# Patient Record
Sex: Female | Born: 1935
Health system: Southern US, Community
[De-identification: ages and names within clinical notes are randomized; demographics above are authoritative.]

## PROBLEM LIST (undated history)

## (undated) DIAGNOSIS — K589 Irritable bowel syndrome without diarrhea: Secondary | ICD-10-CM

## (undated) DIAGNOSIS — F32A Depression, unspecified: Secondary | ICD-10-CM

## (undated) DIAGNOSIS — C8590 Non-Hodgkin lymphoma, unspecified, unspecified site: Secondary | ICD-10-CM

## (undated) DIAGNOSIS — M3500 Sicca syndrome, unspecified: Secondary | ICD-10-CM

## (undated) DIAGNOSIS — C801 Malignant (primary) neoplasm, unspecified: Secondary | ICD-10-CM

## (undated) DIAGNOSIS — K113 Abscess of salivary gland: Secondary | ICD-10-CM

## (undated) DIAGNOSIS — IMO0001 Reserved for inherently not codable concepts without codable children: Secondary | ICD-10-CM

## (undated) DIAGNOSIS — F329 Major depressive disorder, single episode, unspecified: Secondary | ICD-10-CM

## (undated) DIAGNOSIS — Z86010 Personal history of colonic polyps: Secondary | ICD-10-CM

## (undated) DIAGNOSIS — R12 Heartburn: Secondary | ICD-10-CM

## (undated) DIAGNOSIS — IMO0002 Reserved for concepts with insufficient information to code with codable children: Secondary | ICD-10-CM

## (undated) DIAGNOSIS — K219 Gastro-esophageal reflux disease without esophagitis: Secondary | ICD-10-CM

## (undated) HISTORY — DX: Major depressive disorder, single episode, unspecified: F32.9

## (undated) HISTORY — DX: Personal history of colonic polyps: Z86.010

## (undated) HISTORY — PX: TONSILLECTOMY AND ADENOIDECTOMY: SUR1326

## (undated) HISTORY — DX: Sjogren syndrome, unspecified: M35.00

## (undated) HISTORY — DX: Reserved for inherently not codable concepts without codable children: IMO0001

## (undated) HISTORY — DX: Malignant (primary) neoplasm, unspecified: C80.1

## (undated) HISTORY — PX: CATARACT EXTRACTION: SUR2

## (undated) HISTORY — PX: APPENDECTOMY: SHX54

## (undated) HISTORY — DX: Irritable bowel syndrome, unspecified: K58.9

## (undated) HISTORY — PX: SALIVARY GLAND SURGERY: SHX768

## (undated) HISTORY — DX: Depression, unspecified: F32.A

## (undated) HISTORY — DX: Abscess of salivary gland: K11.3

## (undated) HISTORY — DX: Gastro-esophageal reflux disease without esophagitis: K21.9

## (undated) HISTORY — DX: Reserved for concepts with insufficient information to code with codable children: IMO0002

## (undated) HISTORY — DX: Heartburn: R12

---

## 1964-08-31 HISTORY — PX: VEIN SURGERY: SHX48

## 1984-08-31 HISTORY — PX: BREAST BIOPSY: SHX20

## 1990-08-31 DIAGNOSIS — C801 Malignant (primary) neoplasm, unspecified: Secondary | ICD-10-CM

## 1990-08-31 HISTORY — DX: Malignant (primary) neoplasm, unspecified: C80.1

## 1998-08-31 HISTORY — PX: OOPHORECTOMY: SHX86

## 1998-11-08 ENCOUNTER — Other Ambulatory Visit: Admission: RE | Admit: 1998-11-08 | Discharge: 1998-11-08 | Payer: Self-pay | Admitting: *Deleted

## 1998-12-16 ENCOUNTER — Encounter: Payer: Self-pay | Admitting: Thoracic Surgery

## 1998-12-17 ENCOUNTER — Encounter: Payer: Self-pay | Admitting: Thoracic Surgery

## 1998-12-17 ENCOUNTER — Inpatient Hospital Stay: Admission: RE | Admit: 1998-12-17 | Discharge: 1998-12-20 | Payer: Self-pay | Admitting: Thoracic Surgery

## 1998-12-18 ENCOUNTER — Encounter: Payer: Self-pay | Admitting: Thoracic Surgery

## 1998-12-19 ENCOUNTER — Encounter: Payer: Self-pay | Admitting: Thoracic Surgery

## 1999-03-18 ENCOUNTER — Encounter (INDEPENDENT_AMBULATORY_CARE_PROVIDER_SITE_OTHER): Payer: Self-pay | Admitting: Specialist

## 1999-03-18 ENCOUNTER — Inpatient Hospital Stay (HOSPITAL_COMMUNITY): Admission: RE | Admit: 1999-03-18 | Discharge: 1999-03-21 | Payer: Self-pay | Admitting: Obstetrics and Gynecology

## 1999-04-01 HISTORY — PX: LUNG REMOVAL, PARTIAL: SHX233

## 1999-04-23 ENCOUNTER — Encounter: Payer: Self-pay | Admitting: Thoracic Surgery

## 1999-04-24 ENCOUNTER — Encounter: Payer: Self-pay | Admitting: Thoracic Surgery

## 1999-04-24 ENCOUNTER — Inpatient Hospital Stay: Admission: RE | Admit: 1999-04-24 | Discharge: 1999-04-29 | Payer: Self-pay | Admitting: Thoracic Surgery

## 1999-04-24 ENCOUNTER — Encounter: Payer: Self-pay | Admitting: Anesthesiology

## 1999-04-24 ENCOUNTER — Encounter (INDEPENDENT_AMBULATORY_CARE_PROVIDER_SITE_OTHER): Payer: Self-pay | Admitting: Specialist

## 1999-04-25 ENCOUNTER — Encounter: Payer: Self-pay | Admitting: Thoracic Surgery

## 1999-04-26 ENCOUNTER — Encounter: Payer: Self-pay | Admitting: Thoracic Surgery

## 1999-04-27 ENCOUNTER — Encounter: Payer: Self-pay | Admitting: Thoracic Surgery

## 1999-04-27 ENCOUNTER — Encounter: Payer: Self-pay | Admitting: Vascular Surgery

## 1999-04-28 ENCOUNTER — Encounter: Payer: Self-pay | Admitting: Vascular Surgery

## 1999-04-29 ENCOUNTER — Encounter: Payer: Self-pay | Admitting: Thoracic Surgery

## 1999-07-29 ENCOUNTER — Encounter: Admission: RE | Admit: 1999-07-29 | Discharge: 1999-07-29 | Payer: Self-pay | Admitting: Thoracic Surgery

## 1999-07-29 ENCOUNTER — Encounter: Payer: Self-pay | Admitting: Thoracic Surgery

## 1999-10-20 ENCOUNTER — Encounter: Admission: RE | Admit: 1999-10-20 | Discharge: 1999-10-20 | Payer: Self-pay | Admitting: *Deleted

## 1999-10-20 ENCOUNTER — Encounter: Payer: Self-pay | Admitting: *Deleted

## 1999-10-24 ENCOUNTER — Encounter: Payer: Self-pay | Admitting: Thoracic Surgery

## 1999-10-24 ENCOUNTER — Encounter: Admission: RE | Admit: 1999-10-24 | Discharge: 1999-10-24 | Payer: Self-pay | Admitting: Thoracic Surgery

## 1999-11-03 ENCOUNTER — Ambulatory Visit: Admission: RE | Admit: 1999-11-03 | Discharge: 1999-11-03 | Payer: Self-pay | Admitting: Oncology

## 1999-11-10 ENCOUNTER — Other Ambulatory Visit: Admission: RE | Admit: 1999-11-10 | Discharge: 1999-11-10 | Payer: Self-pay | Admitting: *Deleted

## 1999-12-25 ENCOUNTER — Encounter: Payer: Self-pay | Admitting: Oncology

## 1999-12-25 ENCOUNTER — Encounter: Admission: RE | Admit: 1999-12-25 | Discharge: 1999-12-25 | Payer: Self-pay | Admitting: Oncology

## 2000-02-27 ENCOUNTER — Encounter: Payer: Self-pay | Admitting: Thoracic Surgery

## 2000-02-27 ENCOUNTER — Encounter: Admission: RE | Admit: 2000-02-27 | Discharge: 2000-02-27 | Payer: Self-pay | Admitting: Thoracic Surgery

## 2000-07-02 ENCOUNTER — Encounter: Payer: Self-pay | Admitting: Oncology

## 2000-07-02 ENCOUNTER — Encounter: Admission: RE | Admit: 2000-07-02 | Discharge: 2000-07-02 | Payer: Self-pay | Admitting: Oncology

## 2000-10-20 ENCOUNTER — Encounter: Admission: RE | Admit: 2000-10-20 | Discharge: 2000-10-20 | Payer: Self-pay | Admitting: *Deleted

## 2000-10-20 ENCOUNTER — Encounter: Payer: Self-pay | Admitting: *Deleted

## 2000-11-12 ENCOUNTER — Other Ambulatory Visit: Admission: RE | Admit: 2000-11-12 | Discharge: 2000-11-12 | Payer: Self-pay | Admitting: *Deleted

## 2000-12-30 ENCOUNTER — Ambulatory Visit (HOSPITAL_COMMUNITY): Admission: RE | Admit: 2000-12-30 | Discharge: 2000-12-30 | Payer: Self-pay | Admitting: Oncology

## 2000-12-30 ENCOUNTER — Encounter: Payer: Self-pay | Admitting: Oncology

## 2001-08-01 ENCOUNTER — Ambulatory Visit (HOSPITAL_COMMUNITY): Admission: RE | Admit: 2001-08-01 | Discharge: 2001-08-01 | Payer: Self-pay | Admitting: Oncology

## 2001-08-01 ENCOUNTER — Encounter: Payer: Self-pay | Admitting: Oncology

## 2001-10-19 ENCOUNTER — Encounter: Payer: Self-pay | Admitting: *Deleted

## 2001-10-19 ENCOUNTER — Encounter: Admission: RE | Admit: 2001-10-19 | Discharge: 2001-10-19 | Payer: Self-pay | Admitting: *Deleted

## 2002-02-02 ENCOUNTER — Ambulatory Visit (HOSPITAL_COMMUNITY): Admission: RE | Admit: 2002-02-02 | Discharge: 2002-02-02 | Payer: Self-pay | Admitting: Oncology

## 2002-02-02 ENCOUNTER — Encounter: Payer: Self-pay | Admitting: Oncology

## 2002-04-13 ENCOUNTER — Encounter: Admission: RE | Admit: 2002-04-13 | Discharge: 2002-04-13 | Payer: Self-pay | Admitting: General Surgery

## 2002-04-13 ENCOUNTER — Encounter: Payer: Self-pay | Admitting: General Surgery

## 2002-09-05 ENCOUNTER — Ambulatory Visit (HOSPITAL_COMMUNITY): Admission: RE | Admit: 2002-09-05 | Discharge: 2002-09-05 | Payer: Self-pay | Admitting: Oncology

## 2002-09-05 ENCOUNTER — Encounter: Payer: Self-pay | Admitting: Oncology

## 2002-09-13 ENCOUNTER — Encounter: Admission: RE | Admit: 2002-09-13 | Discharge: 2002-09-13 | Payer: Self-pay | Admitting: Oncology

## 2002-09-13 ENCOUNTER — Encounter: Payer: Self-pay | Admitting: Oncology

## 2002-11-22 ENCOUNTER — Other Ambulatory Visit: Admission: RE | Admit: 2002-11-22 | Discharge: 2002-11-22 | Payer: Self-pay | Admitting: Family Medicine

## 2003-06-05 ENCOUNTER — Encounter: Payer: Self-pay | Admitting: Oncology

## 2003-06-05 ENCOUNTER — Ambulatory Visit (HOSPITAL_COMMUNITY): Admission: RE | Admit: 2003-06-05 | Discharge: 2003-06-05 | Payer: Self-pay | Admitting: Oncology

## 2003-07-10 ENCOUNTER — Encounter: Admission: RE | Admit: 2003-07-10 | Discharge: 2003-07-10 | Payer: Self-pay | Admitting: Oncology

## 2003-12-18 ENCOUNTER — Ambulatory Visit (HOSPITAL_COMMUNITY): Admission: RE | Admit: 2003-12-18 | Discharge: 2003-12-18 | Payer: Self-pay | Admitting: Oncology

## 2004-07-29 ENCOUNTER — Encounter: Admission: RE | Admit: 2004-07-29 | Discharge: 2004-07-29 | Payer: Self-pay | Admitting: Family Medicine

## 2004-12-12 ENCOUNTER — Ambulatory Visit: Payer: Self-pay | Admitting: Oncology

## 2004-12-15 ENCOUNTER — Ambulatory Visit (HOSPITAL_COMMUNITY): Admission: RE | Admit: 2004-12-15 | Discharge: 2004-12-15 | Payer: Self-pay | Admitting: Oncology

## 2004-12-15 ENCOUNTER — Other Ambulatory Visit: Admission: RE | Admit: 2004-12-15 | Discharge: 2004-12-15 | Payer: Self-pay | Admitting: Family Medicine

## 2005-07-30 ENCOUNTER — Encounter: Admission: RE | Admit: 2005-07-30 | Discharge: 2005-07-30 | Payer: Self-pay | Admitting: Family Medicine

## 2005-09-22 ENCOUNTER — Ambulatory Visit (HOSPITAL_COMMUNITY): Admission: RE | Admit: 2005-09-22 | Discharge: 2005-09-22 | Payer: Self-pay | Admitting: Family Medicine

## 2005-09-28 ENCOUNTER — Ambulatory Visit (HOSPITAL_COMMUNITY): Admission: RE | Admit: 2005-09-28 | Discharge: 2005-09-28 | Payer: Self-pay | Admitting: Family Medicine

## 2005-12-14 ENCOUNTER — Ambulatory Visit: Payer: Self-pay | Admitting: Oncology

## 2005-12-14 LAB — CBC WITH DIFFERENTIAL/PLATELET
BASO%: 0.5 % (ref 0.0–2.0)
Basophils Absolute: 0 10*3/uL (ref 0.0–0.1)
EOS%: 3.9 % (ref 0.0–7.0)
Eosinophils Absolute: 0.3 10*3/uL (ref 0.0–0.5)
HCT: 39.9 % (ref 34.8–46.6)
HGB: 13.6 g/dL (ref 11.6–15.9)
LYMPH%: 22.5 % (ref 14.0–48.0)
MCH: 29.4 pg (ref 26.0–34.0)
MCHC: 34.2 g/dL (ref 32.0–36.0)
MCV: 86.1 fL (ref 81.0–101.0)
MONO#: 0.8 10*3/uL (ref 0.1–0.9)
MONO%: 9.3 % (ref 0.0–13.0)
NEUT#: 5.2 10*3/uL (ref 1.5–6.5)
NEUT%: 63.8 % (ref 39.6–76.8)
Platelets: 380 10*3/uL (ref 145–400)
RBC: 4.64 10*6/uL (ref 3.70–5.32)
RDW: 12.7 % (ref 11.3–14.5)
WBC: 8.2 10*3/uL (ref 3.9–10.0)
lymph#: 1.8 10*3/uL (ref 0.9–3.3)

## 2006-08-02 ENCOUNTER — Encounter: Admission: RE | Admit: 2006-08-02 | Discharge: 2006-08-02 | Payer: Self-pay | Admitting: Family Medicine

## 2006-08-12 ENCOUNTER — Ambulatory Visit: Payer: Self-pay | Admitting: Oncology

## 2006-08-19 ENCOUNTER — Other Ambulatory Visit: Admission: RE | Admit: 2006-08-19 | Discharge: 2006-08-19 | Payer: Self-pay | Admitting: Otolaryngology

## 2006-08-31 HISTORY — PX: OTHER SURGICAL HISTORY: SHX169

## 2006-09-08 ENCOUNTER — Encounter: Admission: RE | Admit: 2006-09-08 | Discharge: 2006-09-08 | Payer: Self-pay | Admitting: Otolaryngology

## 2006-09-13 ENCOUNTER — Ambulatory Visit (HOSPITAL_COMMUNITY): Admission: RE | Admit: 2006-09-13 | Discharge: 2006-09-13 | Payer: Self-pay | Admitting: Oncology

## 2006-10-21 ENCOUNTER — Ambulatory Visit: Payer: Self-pay | Admitting: Oncology

## 2006-12-06 ENCOUNTER — Ambulatory Visit: Payer: Self-pay | Admitting: Oncology

## 2007-03-02 ENCOUNTER — Ambulatory Visit: Payer: Self-pay | Admitting: Oncology

## 2007-04-04 ENCOUNTER — Ambulatory Visit: Payer: Self-pay | Admitting: Internal Medicine

## 2007-04-13 ENCOUNTER — Encounter: Payer: Self-pay | Admitting: Internal Medicine

## 2007-04-13 ENCOUNTER — Ambulatory Visit: Payer: Self-pay | Admitting: Internal Medicine

## 2007-04-13 DIAGNOSIS — Z860100 Personal history of colon polyps, unspecified: Secondary | ICD-10-CM

## 2007-04-13 HISTORY — DX: Personal history of colon polyps, unspecified: Z86.0100

## 2007-04-13 HISTORY — DX: Personal history of colonic polyps: Z86.010

## 2007-07-06 ENCOUNTER — Ambulatory Visit: Payer: Self-pay | Admitting: Oncology

## 2007-08-04 ENCOUNTER — Encounter: Admission: RE | Admit: 2007-08-04 | Discharge: 2007-08-04 | Payer: Self-pay | Admitting: Oncology

## 2007-11-02 ENCOUNTER — Ambulatory Visit: Payer: Self-pay | Admitting: Oncology

## 2007-11-04 ENCOUNTER — Ambulatory Visit (HOSPITAL_COMMUNITY): Admission: RE | Admit: 2007-11-04 | Discharge: 2007-11-04 | Payer: Self-pay | Admitting: Oncology

## 2007-11-04 LAB — CBC WITH DIFFERENTIAL/PLATELET
BASO%: 1.8 % (ref 0.0–2.0)
Basophils Absolute: 0.1 10*3/uL (ref 0.0–0.1)
EOS%: 4.4 % (ref 0.0–7.0)
Eosinophils Absolute: 0.3 10*3/uL (ref 0.0–0.5)
HCT: 40.9 % (ref 34.8–46.6)
HGB: 14 g/dL (ref 11.6–15.9)
LYMPH%: 23.6 % (ref 14.0–48.0)
MCH: 29.8 pg (ref 26.0–34.0)
MCHC: 34.3 g/dL (ref 32.0–36.0)
MCV: 87 fL (ref 81.0–101.0)
MONO#: 0.7 10*3/uL (ref 0.1–0.9)
MONO%: 8.9 % (ref 0.0–13.0)
NEUT#: 4.6 10*3/uL (ref 1.5–6.5)
NEUT%: 61.4 % (ref 39.6–76.8)
Platelets: 341 10*3/uL (ref 145–400)
RBC: 4.7 10*6/uL (ref 3.70–5.32)
RDW: 11.6 % (ref 11.3–14.5)
WBC: 7.6 10*3/uL (ref 3.9–10.0)
lymph#: 1.8 10*3/uL (ref 0.9–3.3)

## 2007-11-14 ENCOUNTER — Ambulatory Visit (HOSPITAL_COMMUNITY): Admission: RE | Admit: 2007-11-14 | Discharge: 2007-11-14 | Payer: Self-pay | Admitting: Ophthalmology

## 2008-01-17 ENCOUNTER — Ambulatory Visit (HOSPITAL_COMMUNITY): Admission: RE | Admit: 2008-01-17 | Discharge: 2008-01-17 | Payer: Self-pay | Admitting: Cardiology

## 2008-01-31 ENCOUNTER — Ambulatory Visit: Payer: Self-pay | Admitting: Oncology

## 2008-03-09 ENCOUNTER — Other Ambulatory Visit: Admission: RE | Admit: 2008-03-09 | Discharge: 2008-03-09 | Payer: Self-pay | Admitting: Family Medicine

## 2008-06-29 ENCOUNTER — Ambulatory Visit (HOSPITAL_COMMUNITY): Admission: RE | Admit: 2008-06-29 | Discharge: 2008-06-29 | Payer: Self-pay | Admitting: Otolaryngology

## 2008-08-06 ENCOUNTER — Encounter: Admission: RE | Admit: 2008-08-06 | Discharge: 2008-08-06 | Payer: Self-pay | Admitting: Family Medicine

## 2008-10-02 ENCOUNTER — Ambulatory Visit: Payer: Self-pay | Admitting: Oncology

## 2008-12-24 ENCOUNTER — Encounter: Admission: RE | Admit: 2008-12-24 | Discharge: 2008-12-24 | Payer: Self-pay | Admitting: Otolaryngology

## 2009-05-30 ENCOUNTER — Ambulatory Visit: Payer: Self-pay | Admitting: Oncology

## 2009-08-07 ENCOUNTER — Encounter: Admission: RE | Admit: 2009-08-07 | Discharge: 2009-08-07 | Payer: Self-pay | Admitting: Family Medicine

## 2010-01-30 ENCOUNTER — Ambulatory Visit: Payer: Self-pay | Admitting: Oncology

## 2010-06-03 ENCOUNTER — Encounter: Admission: RE | Admit: 2010-06-03 | Discharge: 2010-06-03 | Payer: Self-pay | Admitting: Family Medicine

## 2010-08-18 ENCOUNTER — Encounter
Admission: RE | Admit: 2010-08-18 | Discharge: 2010-08-18 | Payer: Self-pay | Source: Home / Self Care | Attending: Oncology | Admitting: Oncology

## 2011-01-13 NOTE — Op Note (Signed)
NAMEAMIKA, TASSIN             ACCOUNT NO.:  [id number]   MEDICAL RECORD NO.:  [id number]          PATIENT TYPE:  AMB   LOCATION:  SDS                          FACILITY:  [facility]   PHYSICIAN:  Alford Highland. Rankin, M.D.   DATE OF BIRTH:  12-Mar-1933   DATE OF PROCEDURE:  11/14/2007  DATE OF DISCHARGE:  11/14/2007                               OPERATIVE REPORT   PREOPERATIVE DIAGNOSIS:  Epiretinal membrane--left eye--internal  limiting membrane striae.   POSTOPERATIVE DIAGNOSIS:  Epiretinal membrane--left eye--internal  limiting membrane striae.   PROCEDURE:  Posterior vitrectomy with membrane peel, left eye---internal  limiting membrane--25 gauge with ICG guidance.   SURGEON:  Alford Highland. Rankin, MD   ANESTHESIA:  Local retrobulbar with monitored anesthesia control.   INDICATIONS FOR PROCEDURE:  The patient is a 77 year old woman who has  profound vision loss in the left eye which has a profound impact on her  activities of daily living by moderate vision loss and life style on the  basis of epiretinal membrane.  She is recently pseudophakic,  and now  her visual acuity has dropped to the point where she is having  difficulty with her activities of daily living.  She understands this  and attempted to remove the epiretinal membrane.  She understands the  risks of anesthesia and including recurrence, death, and loss of the eye  from the underlying procedure as well as surgical repair, including, but  not limited to hemorrhage, infection, scarring, need for surgery, no  change in vision, loss of vision, progression of diseases in spite of  intervention.  Appropriate, signed consent was obtained.   PROCEDURE IN DETAIL:  The patient was taken to the operating room.  In  the operating room appropriate monitoring was followed by mild sedation.  Xylocaine 2% in the left eye was placed, 5 mL retrobulbar, with an  additional 5 mL later in the fashion of modified Darel Hong.  The left  periocular  region was sterilely  prepped and draped in the usual  ophthalmic fashion.  The lid speculum applied.  The patient had an  immobile eye, but she still had some mild discomfort with forceps and  manipulation of the conjunctivae.  Xylocaine 0.5 mL of 1% Xylocaine, no  epinephrine was then placed temporally and subconjunctivally as well as  superiorly.  The 25 gauge trocars were applied.  The superior trocars  were applied and fusion turned on.  Through the superior trocars a 25  gauge vitrectomy was then begun.  The posterior vitreous attachment had  been spontaneous and thus the vitreous hyaloid was moved and circumcised  360 degrees up to the vitreous base.  The internal limiting membrane was  buried and the epiretinal membrane was difficult to visualize with the  exception of the striae because of the underlying orange choroid, and  translucent nature of the retina.  This made for very poor contrast.  For this reason it was necessary after several attempts superiorly to  grasp the internal limiting membrane.  It was necessary to stain the  internal limiting membrane with very dilute ICG.  One mL  of ICG was  diluted with 7 mL of BSS to form a dilute solution.   At this time fluid-air exchange completed.  Over approximately 2-3 mL  more of BSS over the posterior pole, the ICG was placed.  This was left  in place for less than 30 seconds.  This was removed medially with the  soft-tipped extrusion needle.  Thereafter an air-fluid exchange  completed under fluid.  A 25 gauge port was then used to engage internal  limiting membrane, which was now well visualized and all attachments to  the fovea region were removed.   This dissection was then carried out for approximately 1.5 disc  diameters around the center of the fovea.  No complications occurred.  Instruments were removed from the eye.  Vitrectomy was then done to  remove all fragments of the floating ICG-stained IOM.  No complications   occurred.  Peripheral retina was inspected and found to be free of  retinal holes or tears.  Superior trocars were removed.  The infusion  was removed.  Subconjunctival Decadron applied.  Sterile patch and Fox  shield applied.  The patient tolerated the procedure well without  complications.  Taken to the short stay area to be discharged home as an  outpatient.      Alford Highland Rankin, M.D.  Electronically Signed     GAR/MEDQ  D:  11/14/2007  T:  11/14/2007  Job:  [id number]

## 2011-01-30 ENCOUNTER — Encounter (HOSPITAL_BASED_OUTPATIENT_CLINIC_OR_DEPARTMENT_OTHER): Payer: Medicare Other | Admitting: Oncology

## 2011-01-30 DIAGNOSIS — K113 Abscess of salivary gland: Secondary | ICD-10-CM

## 2011-01-30 DIAGNOSIS — Z87898 Personal history of other specified conditions: Secondary | ICD-10-CM

## 2011-01-30 HISTORY — DX: Abscess of salivary gland: K11.3

## 2011-02-13 ENCOUNTER — Ambulatory Visit (HOSPITAL_COMMUNITY)
Admission: RE | Admit: 2011-02-13 | Discharge: 2011-02-13 | Disposition: A | Discharge status: Home / Self Care | Payer: Medicare Other | Source: Ambulatory Visit | Attending: Otolaryngology | Admitting: Otolaryngology

## 2011-02-13 ENCOUNTER — Encounter (HOSPITAL_COMMUNITY): Payer: Self-pay

## 2011-02-13 ENCOUNTER — Other Ambulatory Visit (HOSPITAL_COMMUNITY): Payer: Self-pay | Admitting: Otolaryngology

## 2011-02-13 DIAGNOSIS — K113 Abscess of salivary gland: Secondary | ICD-10-CM

## 2011-02-13 HISTORY — DX: Non-Hodgkin lymphoma, unspecified, unspecified site: C85.90

## 2011-02-13 LAB — CREATININE, SERUM
Creatinine, Ser: 1.06 mg/dL (ref 0.50–1.10)
GFR calc Af Amer: >60 mL/min (ref >60)
GFR calc non Af Amer: 51 mL/min — ABNORMAL LOW (ref >60)

## 2011-02-13 LAB — BUN: BUN: 13 mg/dL (ref 6–23)

## 2011-02-13 MED ORDER — IOHEXOL 300 MG/ML  SOLN
75.0000 mL | Freq: Once | INTRAMUSCULAR | Status: AC | PRN
  Administered 2011-02-13: 75 mL via INTRAVENOUS

## 2011-02-21 ENCOUNTER — Other Ambulatory Visit: Payer: Self-pay | Admitting: Otolaryngology

## 2011-02-21 ENCOUNTER — Inpatient Hospital Stay (HOSPITAL_COMMUNITY)
Admission: RE | Admit: 2011-02-21 | Discharge: 2011-02-25 | DRG: 156 | Disposition: A | Discharge status: Home / Self Care | Payer: Medicare Other | Source: Ambulatory Visit | Attending: Otolaryngology | Admitting: Otolaryngology

## 2011-02-21 ENCOUNTER — Ambulatory Visit (HOSPITAL_COMMUNITY)
Admission: RE | Admit: 2011-02-21 | Discharge: 2011-02-21 | Disposition: A | Discharge status: Home / Self Care | Payer: Medicare Other | Source: Ambulatory Visit | Attending: Otolaryngology | Admitting: Otolaryngology

## 2011-02-21 DIAGNOSIS — K113 Abscess of salivary gland: Principal | ICD-10-CM | POA: Diagnosis present

## 2011-02-21 LAB — BASIC METABOLIC PANEL
BUN: 12 mg/dL (ref 6–23)
CO2: 27 mEq/L (ref 19–32)
Calcium: 9.7 mg/dL (ref 8.4–10.5)
Chloride: 99 mEq/L (ref 96–112)
Creatinine, Ser: 1.01 mg/dL (ref 0.50–1.10)
GFR calc Af Amer: >60 mL/min (ref >60)
GFR calc non Af Amer: 53 mL/min — ABNORMAL LOW (ref >60)
Glucose, Bld: 117 mg/dL — ABNORMAL HIGH (ref 70–99)
Potassium: 4.9 mEq/L (ref 3.5–5.1)
Sodium: 137 mEq/L (ref 135–145)

## 2011-02-21 LAB — CBC
HCT: 38.5 % (ref 36.0–46.0)
Hemoglobin: 13.3 g/dL (ref 12.0–15.0)
MCH: 29.3 pg (ref 26.0–34.0)
MCHC: 34.5 g/dL (ref 30.0–36.0)
MCV: 84.8 fL (ref 78.0–100.0)
Platelets: 408 10*3/uL — ABNORMAL HIGH (ref 150–400)
RBC: 4.54 MIL/uL (ref 3.87–5.11)
RDW: 12.2 % (ref 11.5–15.5)
WBC: 10.6 10*3/uL — ABNORMAL HIGH (ref 4.0–10.5)

## 2011-02-21 LAB — SURGICAL PCR SCREEN
MRSA, PCR: NEGATIVE
Staphylococcus aureus: NEGATIVE

## 2011-02-23 DIAGNOSIS — K1120 Sialoadenitis, unspecified: Secondary | ICD-10-CM

## 2011-02-24 LAB — CBC
HCT: 32.5 % — ABNORMAL LOW (ref 36.0–46.0)
Hemoglobin: 11.1 g/dL — ABNORMAL LOW (ref 12.0–15.0)
MCH: 29.3 pg (ref 26.0–34.0)
MCHC: 34.2 g/dL (ref 30.0–36.0)
MCV: 85.8 fL (ref 78.0–100.0)
Platelets: 358 10*3/uL (ref 150–400)
RBC: 3.79 MIL/uL — ABNORMAL LOW (ref 3.87–5.11)
RDW: 12.5 % (ref 11.5–15.5)
WBC: 8 10*3/uL (ref 4.0–10.5)

## 2011-02-24 LAB — CULTURE, ROUTINE-ABSCESS: Culture: NO GROWTH

## 2011-02-24 LAB — SJOGRENS SYNDROME-B EXTRACTABLE NUCLEAR ANTIBODY: SSB (La) (ENA) Antibody, IgG: <1 AU/mL (ref <30)

## 2011-02-24 LAB — SJOGRENS SYNDROME-A EXTRACTABLE NUCLEAR ANTIBODY: SSA (Ro) (ENA) Antibody, IgG: 1 AU/mL (ref <30)

## 2011-02-25 ENCOUNTER — Other Ambulatory Visit: Payer: Self-pay | Admitting: Oncology

## 2011-02-25 DIAGNOSIS — C8590 Non-Hodgkin lymphoma, unspecified, unspecified site: Secondary | ICD-10-CM

## 2011-02-25 LAB — RHEUMATOID FACTOR: Rhuematoid fact SerPl-aCnc: <10 IU/mL (ref <=14)

## 2011-02-26 LAB — ANTI-NUCLEAR AB-TITER (ANA TITER): ANA Titer 1: 1:40 {titer} — ABNORMAL HIGH

## 2011-02-26 LAB — ANAEROBIC CULTURE

## 2011-02-26 LAB — ANA: Anti Nuclear Antibody(ANA): POSITIVE — AB

## 2011-02-27 LAB — SJOGRENS SYNDROME-A EXTRACTABLE NUCLEAR ANTIBODY: SSA (Ro) (ENA) Antibody, IgG: 1 AU/mL (ref <30)

## 2011-02-27 LAB — SJOGRENS SYNDROME-B EXTRACTABLE NUCLEAR ANTIBODY: SSB (La) (ENA) Antibody, IgG: <1 AU/mL (ref <30)

## 2011-02-27 NOTE — Op Note (Signed)
  NAMEXZARIA, TEO NO.:  [id number]  MEDICAL RECORD NO.:  [id number]  LOCATION:  XRAY                         FACILITY:  [facility]  PHYSICIAN:  Zola Button T. Lazarus Salines, M.D. DATE OF BIRTH:  [birth year]  DATE OF PROCEDURE:  02/21/2011 DATE OF DISCHARGE:                              OPERATIVE REPORT   PREOPERATIVE DIAGNOSIS:  Left parotid abscess.  POSTOPERATIVE DIAGNOSIS:  Left parotid abscess.  PROCEDURE PERFORMED:  Incision and drainage, left parotid abscess.  SURGEON:  Gloris Manchester. Sherrel Shafer, MD  ANESTHESIA:  General orotracheal.  BLOOD LOSS:  Minimal.  COMPLICATIONS:  None.  FINDINGS:  Edematous, erythematous periparotid skin with tense induration of the gland.  On exploration and blunt dissection, numerous areas containing saliva admixed with pus and also multiple small stones.  PROCEDURE:  With the patient in a comfortable supine position, general orotracheal anesthesia was induced without difficulty.  At an appropriate level, the patient was placed in a slight reverse Trendelenburg with the head rotated to the right for access to the left neck.  The neck was examined and palpated with the findings as described above.  The orienting initials were identified.  At the tail of parotid, a curvilinear incision was planned and infiltrated with 5 mL of 1% Xylocaine With 1:100,000 Epinephrine.  The sterile preparation and draping of the left face and neck was accomplished in the standard fashion.  A 3 cm curved incision was sharply executed over the tail of the parotid consistent with a parotidectomy incision.  The flap was elevated anteriorly in the dermal layer and then posteriorly.  Some pus was encountered.  Upon exposing the gland, a moderately sharp hemostat was used to penetrate the gland multiple times bluntly and then spread. Cultures were taken for aerobes and anaerobes.  Small stones were extracted.  Upon exploring the entire palpable gland, the wound  was thoroughly irrigated with approximately 500 mL of saline.  Hemostasis was observed.  One-quarter inch Penrose drains were placed into the wound high and low, secured to the skin edge with a 4-0 nylon stitch and then some safety pins.  Again hemostasis was observed.  A bulky 4-inch Kerlix wrap was applied.  At this point, the procedure was completed. The patient was returned to Anesthesia, awakened, extubated, and transferred to recovery in stable condition.  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  By CT scanning, multiple cystic chambers in both salivary glands as well as multiple small stones.  A recent episode has cleared on the right with antibiotics but failed to clear on the left. The culture 4 days ago was negative.  The progression of the disease indicated the need for incision and drainage as above.  We will observe her in the hospital including coverage with vancomycin and Zosyn.  We will await culture results to determine oral antibiotic coverage.     Gloris Manchester. Lazarus Salines, M.D.     KTW/MEDQ  D:  02/21/2011  T:  02/21/2011  Job:  [id number]  cc:   Stacie Acres. Cliffton Asters, M.D. Ladene Artist, M.D.  Electronically Signed by Flo Shanks M.D. on 02/27/2011 08:58:03 AM

## 2011-03-11 ENCOUNTER — Encounter (HOSPITAL_COMMUNITY): Payer: Self-pay

## 2011-03-11 ENCOUNTER — Encounter (HOSPITAL_COMMUNITY)
Admission: RE | Admit: 2011-03-11 | Discharge: 2011-03-11 | Disposition: A | Discharge status: Home / Self Care | Payer: Medicare Other | Source: Ambulatory Visit | Attending: Oncology | Admitting: Oncology

## 2011-03-11 DIAGNOSIS — C8589 Other specified types of non-Hodgkin lymphoma, extranodal and solid organ sites: Secondary | ICD-10-CM | POA: Insufficient documentation

## 2011-03-11 DIAGNOSIS — C8590 Non-Hodgkin lymphoma, unspecified, unspecified site: Secondary | ICD-10-CM

## 2011-03-11 LAB — GLUCOSE, CAPILLARY: Glucose-Capillary: 106 mg/dL — ABNORMAL HIGH (ref 70–99)

## 2011-03-11 MED ORDER — FLUDEOXYGLUCOSE F - 18 (FDG) INJECTION
18.3000 | Freq: Once | INTRAVENOUS | Status: AC | PRN
  Administered 2011-03-11: 18.3 via INTRAVENOUS

## 2011-03-13 NOTE — Discharge Summary (Signed)
NAMEMARIAPAULA, Nicole [name] NO.:  [id number]  MEDICAL RECORD NO.:  [id number]  LOCATION:  5506                         FACILITY:  [facility]  PHYSICIAN:  Zola Button T. Lazarus Salines, M.D. DATE OF BIRTH:  April 03, 1933  DATE OF ADMISSION:  02/21/2011 DATE OF DISCHARGE:  02/25/2011                              DISCHARGE SUMMARY   CHIEF COMPLAINT:  Left cheek swelling.  HISTORY OF PRESENT ILLNESS:  A 77 year old white female with a history of recurrent parotitis with documented sialectasis and multiple stones throughout the parenchyma of the parotid gland on both sides. Approximately, 10 days prior to admission, she came down with fever and swelling in her parotid glands, left more so than right.  She received a shot of Rocephin from her family physician and was begun on clindamycin. Several days later, she saw Dr. Jenne Pane who has been her ENT caretaker, who increased the dose of clindamycin, gave her some Vicodin for pain control.  A CT scan at that time showed multiple loculations of fluid in both glands and multiple stones.  She also had a new right lung field mass with a distant history of non-Hodgkin's lymphoma.  After 3 days on the antibiotics, the right side was improving, but the left side was more tender, more swollen.  He performed a needle aspiration of 3 mL of frank pus.  Cultures were negative at the time of admission.  Three days later, she continued to get worse with overlying edema, pitting, and severe tenderness.  PAST MEDICAL HISTORY:  Non-Hodgkin's lymphoma.  ALLERGIES:  No known allergies.  MEDICATIONS:  No current medications except for the clindamycin and Vicodin.  REVIEW OF SYSTEMS:  Noncontributory.  PHYSICAL EXAMINATION:  GENERAL:  This is a trim, animated, and basically healthy middle-aged white female, appearing older than stated age. HEENT:  Facial nerve is intact on both sides.  She has slight swelling on the right parotid which is minimally tender.   She has a fairly extensive swelling of the left parotid with overlying skin erythema, edema, and pain.  No obvious fluctuance.  Head and neck exam otherwise was remarkable for some purulent mixed with clear saliva expressed from the left Stensen's duct.  No lymphadenopathy. LUNGS:  Clear to have auscultation. CARDIAC:  With regular rate and rhythm.  No murmurs. ABDOMEN:  Soft and active. EXTREMITIES:  Normal configuration.  ADMISSION LABORATORY STUDIES:  White blood cell count 10,300.  ADMISSION DIAGNOSIS:  Left parotid abscesses secondary to chronic left parotitis.  HOSPITAL COURSE:  On the day of her admission, she was taken to the operating room where a periparotid flap was developed and the gland itself was penetrated with a hemostat multiple times, draining multiple small loculations of pus.  Several stones were produced.  Cultures were taken for aerobes and anaerobes.  Penrose drains were placed and an absorbent dressing.  This completed the procedure.  The patient was returned from the operating room to the patient care floor on Zosyn and vancomycin to rule out MRSA.  Infectious Disease consultation was obtained.  She had minimal pain.  She had fairly rapid reduction in drainage and the drains were advanced and discontinued by postoperative day #3.  A repeat white  blood cell count was 8000.  Cultures were all negative at the time of her discharge.  One day prior to discharge, her IV was discontinued and she was begun on oral Avelox at the recommendation of Infectious Disease.  She tolerated this nicely.  On the fourth postoperative day, she was discharged to her home in the care of her family.  The wound is still open and draining and she will continue dressing changes at home.  I gave her some Vicodin for pain relief.  She is okay to shower.  I will check her back in 5 days, sooner as needed.  Eventually, she will require parotidectomy on both sides to alleviate this chronic  problem.  FINAL DIAGNOSIS:  Chronic keratitis with left parotid abscesses.  PROCEDURE PERFORMED:  Incision and drainage, left parotid abscess, June 23.  COMPLICATIONS:  None.  CONDITION:  Ambulatory, drainage, decreasing pain control, full regular diet, cultures all negative.  Facial nerve intact following surgery. Prescriptions, Avelox and Vicodin.  Recheck in 5 days.  Instructions were written and given.     Gloris Manchester. Lazarus Salines, M.D.     KTW/MEDQ  D:  02/25/2011  T:  02/26/2011  Job:  [id number]  cc:   Stacie Acres. Cliffton Asters, M.D.  Electronically Signed by Flo Shanks M.D. on 03/13/2011 06:49:39 PM

## 2011-03-18 ENCOUNTER — Encounter (HOSPITAL_BASED_OUTPATIENT_CLINIC_OR_DEPARTMENT_OTHER): Payer: Medicare Other | Admitting: Oncology

## 2011-03-18 DIAGNOSIS — Z87898 Personal history of other specified conditions: Secondary | ICD-10-CM

## 2011-03-31 ENCOUNTER — Encounter: Payer: Medicare Other | Admitting: Thoracic Surgery

## 2011-04-07 ENCOUNTER — Ambulatory Visit
Admission: RE | Admit: 2011-04-07 | Discharge: 2011-04-07 | Disposition: A | Discharge status: Home / Self Care | Payer: Medicare Other | Source: Ambulatory Visit | Attending: Thoracic Surgery | Admitting: Thoracic Surgery

## 2011-04-07 ENCOUNTER — Encounter (INDEPENDENT_AMBULATORY_CARE_PROVIDER_SITE_OTHER): Payer: Medicare Other | Admitting: Thoracic Surgery

## 2011-04-07 ENCOUNTER — Other Ambulatory Visit: Payer: Self-pay | Admitting: Thoracic Surgery

## 2011-04-07 DIAGNOSIS — R911 Solitary pulmonary nodule: Secondary | ICD-10-CM

## 2011-04-07 DIAGNOSIS — D381 Neoplasm of uncertain behavior of trachea, bronchus and lung: Secondary | ICD-10-CM

## 2011-04-08 NOTE — Assessment & Plan Note (Signed)
OFFICE VISIT  Nicole [name], Nicole [name] DOB:  1932/11/04                                        April 07, 2011 CHART #:  [id number]  The patient came for followup today and I reviewed her CT scan.  Twelve years ago, we removed mild lymphoma and she had postop chemotherapy. She now has had several drainages, but brought again for multiple stones and has had sialography  as well as dilatation of her carotid ducts. She comes in today with a new positive PET around the bronchus intermedius of the right lower lobe as well as one other area in the right upper lobe.  Her blood pressure is 151/69, pulse 68, respirations 16, sats were 95%.  We discussed the options available to try to make a diagnosis and we decided to try to do bronchoscopy with endobronchial ultrasound and ENB.  This may or may not give a diagnosis, but this is only other way to do, this will probably be with an open procedure.  She understands this and we will plan to do this on April 13, 2011.  Ines Bloomer, M.D. Electronically Signed  DPB/MEDQ  D:  04/07/2011  T:  04/08/2011  Job:  [id number]  cc:   Ladene Artist, M.D.

## 2011-04-09 ENCOUNTER — Encounter (HOSPITAL_COMMUNITY)
Admission: RE | Admit: 2011-04-09 | Discharge: 2011-04-09 | Disposition: A | Discharge status: Home / Self Care | Payer: Medicare Other | Source: Ambulatory Visit | Attending: Thoracic Surgery | Admitting: Thoracic Surgery

## 2011-04-09 LAB — APTT: aPTT: 27 seconds (ref 24–37)

## 2011-04-09 LAB — COMPREHENSIVE METABOLIC PANEL
ALT: 18 U/L (ref 0–35)
AST: 15 U/L (ref 0–37)
Albumin: 3.4 g/dL — ABNORMAL LOW (ref 3.5–5.2)
Alkaline Phosphatase: 60 U/L (ref 39–117)
BUN: 15 mg/dL (ref 6–23)
CO2: 26 mEq/L (ref 19–32)
Calcium: 10 mg/dL (ref 8.4–10.5)
Chloride: 102 mEq/L (ref 96–112)
Creatinine, Ser: 0.88 mg/dL (ref 0.50–1.10)
GFR calc Af Amer: >60 mL/min (ref >60)
GFR calc non Af Amer: >60 mL/min (ref >60)
Glucose, Bld: 115 mg/dL — ABNORMAL HIGH (ref 70–99)
Potassium: 4.8 mEq/L (ref 3.5–5.1)
Sodium: 138 mEq/L (ref 135–145)
Total Bilirubin: 0.3 mg/dL (ref 0.3–1.2)
Total Protein: 7.4 g/dL (ref 6.0–8.3)

## 2011-04-09 LAB — CBC
HCT: 41.1 % (ref 36.0–46.0)
Hemoglobin: 13.9 g/dL (ref 12.0–15.0)
MCH: 29.4 pg (ref 26.0–34.0)
MCHC: 33.8 g/dL (ref 30.0–36.0)
MCV: 86.9 fL (ref 78.0–100.0)
Platelets: 312 10*3/uL (ref 150–400)
RBC: 4.73 MIL/uL (ref 3.87–5.11)
RDW: 12.6 % (ref 11.5–15.5)
WBC: 9.6 10*3/uL (ref 4.0–10.5)

## 2011-04-09 LAB — PROTIME-INR
INR: 1 (ref 0.00–1.49)
Prothrombin Time: 13.4 seconds (ref 11.6–15.2)

## 2011-04-13 ENCOUNTER — Other Ambulatory Visit: Payer: Self-pay | Admitting: Thoracic Surgery

## 2011-04-13 ENCOUNTER — Ambulatory Visit (HOSPITAL_COMMUNITY)
Admission: RE | Admit: 2011-04-13 | Discharge: 2011-04-13 | Disposition: A | Discharge status: Home / Self Care | Payer: Medicare Other | Source: Ambulatory Visit | Attending: Thoracic Surgery | Admitting: Thoracic Surgery

## 2011-04-13 ENCOUNTER — Ambulatory Visit (HOSPITAL_COMMUNITY): Payer: Medicare Other

## 2011-04-13 DIAGNOSIS — Z79899 Other long term (current) drug therapy: Secondary | ICD-10-CM | POA: Insufficient documentation

## 2011-04-13 DIAGNOSIS — R918 Other nonspecific abnormal finding of lung field: Secondary | ICD-10-CM

## 2011-04-13 DIAGNOSIS — Z902 Acquired absence of lung [part of]: Secondary | ICD-10-CM | POA: Insufficient documentation

## 2011-04-13 DIAGNOSIS — Z01812 Encounter for preprocedural laboratory examination: Secondary | ICD-10-CM | POA: Insufficient documentation

## 2011-04-13 DIAGNOSIS — K219 Gastro-esophageal reflux disease without esophagitis: Secondary | ICD-10-CM | POA: Insufficient documentation

## 2011-04-13 DIAGNOSIS — J4489 Other specified chronic obstructive pulmonary disease: Secondary | ICD-10-CM | POA: Insufficient documentation

## 2011-04-13 DIAGNOSIS — I10 Essential (primary) hypertension: Secondary | ICD-10-CM | POA: Insufficient documentation

## 2011-04-13 DIAGNOSIS — R942 Abnormal results of pulmonary function studies: Secondary | ICD-10-CM | POA: Insufficient documentation

## 2011-04-13 DIAGNOSIS — D381 Neoplasm of uncertain behavior of trachea, bronchus and lung: Secondary | ICD-10-CM

## 2011-04-13 DIAGNOSIS — Z87891 Personal history of nicotine dependence: Secondary | ICD-10-CM | POA: Insufficient documentation

## 2011-04-13 DIAGNOSIS — C8589 Other specified types of non-Hodgkin lymphoma, extranodal and solid organ sites: Secondary | ICD-10-CM | POA: Insufficient documentation

## 2011-04-13 DIAGNOSIS — J449 Chronic obstructive pulmonary disease, unspecified: Secondary | ICD-10-CM | POA: Insufficient documentation

## 2011-04-15 ENCOUNTER — Ambulatory Visit: Payer: Medicare Other | Admitting: Thoracic Surgery

## 2011-04-15 LAB — CULTURE, RESPIRATORY W GRAM STAIN

## 2011-04-16 ENCOUNTER — Ambulatory Visit (INDEPENDENT_AMBULATORY_CARE_PROVIDER_SITE_OTHER): Payer: Medicare Other | Admitting: Thoracic Surgery

## 2011-04-16 DIAGNOSIS — D381 Neoplasm of uncertain behavior of trachea, bronchus and lung: Secondary | ICD-10-CM

## 2011-04-16 NOTE — Assessment & Plan Note (Signed)
OFFICE VISIT  Nicole, [name] DOB:  1933/03/03                                        April 16, 2011 CHART #:  [id number]  The patient came today after her bronchoscopy and endobronchial ultrasound.  Unfortunately, the tests were all nondiagnostic.  We did not obtain enough lymphocytes for them to do flow cytometry despite multiple aspirations of lymph node and her ENB was also did show inflammatory changes.  I explained this to the patient and said that I would discuss it with Dr. Truett Perna, we would have to continue to watch it and I am not sure that much else we can do now because she is not on any other way to get more tissue that we would an open biopsy.  Blood pressure is 132/69, pulse 69, respirations 18, and sats were 97%.  Ines Bloomer, M.D. Electronically Signed  DPB/MEDQ  D:  04/16/2011  T:  04/16/2011  Job:  [id number]  cc:   Ladene Artist, M.D.

## 2011-04-17 ENCOUNTER — Encounter (HOSPITAL_BASED_OUTPATIENT_CLINIC_OR_DEPARTMENT_OTHER): Payer: Medicare Other | Admitting: Oncology

## 2011-04-17 ENCOUNTER — Other Ambulatory Visit: Payer: Self-pay | Admitting: Oncology

## 2011-04-17 DIAGNOSIS — C8590 Non-Hodgkin lymphoma, unspecified, unspecified site: Secondary | ICD-10-CM

## 2011-04-17 DIAGNOSIS — Z87898 Personal history of other specified conditions: Secondary | ICD-10-CM

## 2011-04-24 ENCOUNTER — Other Ambulatory Visit: Payer: Self-pay | Admitting: Dermatology

## 2011-04-27 NOTE — Op Note (Addendum)
  Nicole [name], Nicole [name] NO.:  [id number]  MEDICAL RECORD NO.:  [id number]  LOCATION:  XRAY                         FACILITY:  [facility]  PHYSICIAN:  Ines Bloomer, M.D. DATE OF BIRTH:  [birth year]  DATE OF PROCEDURE:  04/13/2011 DATE OF DISCHARGE:  04/13/2011                              OPERATIVE REPORT   PREOPERATIVE DIAGNOSIS:  Status post small lymphoma, status post right upper lobectomy, questionable recurrence.  POSTOPERATIVE DIAGNOSIS:  Status post small lymphoma, status post right upper lobectomy, questionable recurrence.  OPERATION PERFORMED:  Fiberoptic bronchoscopy with endobronchial ultrasound.  SURGEON:  Ines Bloomer, MD  CLINICAL HISTORY:  About 12 years ago, the patient underwent a right upper lobectomy and partial resection of right middle lobe for a small lymphoma.  She has been treated by Dr. Truett Perna and now on followup scans.  She also had parotid disease with questionable lymphocytic infiltrate.  On followup scans, she has developed an area of positivity in and around the bronchus intermedius as well as in a linear area near where the previous resection was.  She is brought to the operating room for possible biopsy.  DESCRIPTION OF PROCEDURE:  After general anesthesia, the video bronchoscope was passed through the endotracheal tube.  The carina was in the midline.  The right upper lobe stump was well healed.  The right middle lobe and right lower lobe orifices were normal.  The left mainstem, left upper lobe, and left lower lobe orifices were normal.  We then inserted the endobronchial ultrasound scope and identified some 10L nodes around the bronchus intermedius.  There were two areas, one more medial and one more lateral.  We did multiple aspirations from this area by passing the sheath through the working channel and then passed the needle under ultrasound guidance out into the lymph node.  We did 6 aspirations.  After this was  done, we removed endobronchoscope, inserted the regular scope with the extended working channel and locatable guide and did automatic registration.  We then passed the scope under guidance down to the right middle lobe bronchus and out the right middle lobe bronchus to within 1 cm of the linear scar and through that we passed the needle brushings.  It was very difficult to get to this, because we had use the 180 catheter and despite multiple brushings we could not do a biopsy as the biopsy forceps would not go through the extended working channel.  So after multiple biopsies, we terminated the procedure.  We sent the cells for washings. She tolerated the procedure well and was returned to the recovery room in stable condition.     Ines Bloomer, M.D.     DPB/MEDQ  D:  04/16/2011  T:  04/16/2011  Job:  [id number]  Electronically Signed by Jovita Gamma M.D. on 05/13/2011 02:26:30 PM

## 2011-05-08 NOTE — Op Note (Addendum)
  NAMETENLEE, WOLLIN NO.:  [id number]  MEDICAL RECORD NO.:  [id number]  LOCATION:  XRAY                         FACILITY:  [facility]  PHYSICIAN:  Ines Bloomer, M.D. DATE OF BIRTH:  05-12-1933  DATE OF PROCEDURE:  04/13/2011 DATE OF DISCHARGE:  04/13/2011                              OPERATIVE REPORT   PREOPERATIVE DIAGNOSIS:  Right status post resection of mild lymphoma, possible recurrence.  POSTOPERATIVE DIAGNOSIS:  Right status post resection of mild lymphoma, possible recurrence.  OPERATION PERFORMED:  Fiberoptic bronchoscopy with endobronchial ultrasound and electromagnetic navigation.  SURGEON:  Ines Bloomer, MD  DESCRIPTION OF PROCEDURE:  After general anesthesia, the video bronchoscope was passed through the endotracheal tube.  The carina was in the midline.  The left mainstem, left upper lobe, and left lower lobe orifices were normal.  On the right side, the previous bronchial stumpappeared to be essentially normal.  The right middle lobe and right lower lobe orifices were normal.  We then passed the endobronchial ultrasound and were able to identify some 10R nodes that were next to the bronchus intermedius.  We then through the bronchus intermedius went through the working channel, passed the sheath, and then passed the needle directly through the sheath out into the lymph node.  This was identified by ultrasound that we were in the lymph node.  We then applied aspiration on the needle which was a modified Wang needle with several areas of aspiration.  We then removed the rim of the needle and repeated this 3 more passes.  After this was done, we then inserted a regular bronchoscope with a standard working channel and the locatable guide for electromagnetic navigational bronchoscopy.  Automatic registration was done and then we passed the bronchoscope down into the right middle lobe bronchus and to the target that was close to the previous  resected line.  The patient had a previous right upper lobectomy with partial resection of the right middle lobe.  Through that, we passed brushings in that area, a needle brush into the area to get between 1-1.5 cm from the target and then we did another Wang needle into that area and then aspirating and finally did some biopsies in this area.  It was very hard to get through because of the right middle lobe being pulled up and we had to first use the 90 catheter and then a 180- degree catheter and even then we were able to get some only some small biopsies.  We then removed the extended working channel and the locatable guide and did direct biopsies of the right upper lobe bronchial stump using the 2.2 biopsy forceps.  The patient tolerated the procedure well and was returned to the recovery room in stable condition.     Ines Bloomer, M.D.    DPB/MEDQ  D:  04/27/2011  T:  04/27/2011  Job:  [id number]  Electronically Signed by Jovita Gamma M.D. on 05/13/2011 02:26:32 PM

## 2011-05-10 LAB — FUNGUS CULTURE W SMEAR: Fungal Smear: NONE SEEN

## 2011-05-25 LAB — BASIC METABOLIC PANEL
BUN: 12
CO2: 29
Calcium: 9.8
Chloride: 103
Creatinine, Ser: 0.83
GFR calc Af Amer: >60
GFR calc non Af Amer: >60
Glucose, Bld: 92
Potassium: 4.8
Sodium: 140

## 2011-05-25 LAB — CBC
HCT: 39.8
Hemoglobin: 13.6
MCHC: 34.2
MCV: 87.8
Platelets: 379
RBC: 4.54
RDW: 12.1
WBC: 7.9

## 2011-05-25 LAB — PROTIME-INR
INR: 0.9
Prothrombin Time: 12.2

## 2011-05-25 LAB — APTT: aPTT: 26

## 2011-05-26 LAB — AFB CULTURE WITH SMEAR (NOT AT ARMC): Acid Fast Smear: NONE SEEN

## 2011-06-28 ENCOUNTER — Encounter: Payer: Self-pay | Admitting: *Deleted

## 2011-07-01 ENCOUNTER — Other Ambulatory Visit: Payer: Self-pay | Admitting: Dermatology

## 2011-07-13 ENCOUNTER — Ambulatory Visit (HOSPITAL_COMMUNITY)
Admission: RE | Admit: 2011-07-13 | Discharge: 2011-07-13 | Disposition: A | Discharge status: Home / Self Care | Payer: Medicare Other | Source: Ambulatory Visit | Attending: Oncology | Admitting: Oncology

## 2011-07-13 DIAGNOSIS — R918 Other nonspecific abnormal finding of lung field: Secondary | ICD-10-CM | POA: Insufficient documentation

## 2011-07-13 DIAGNOSIS — C8590 Non-Hodgkin lymphoma, unspecified, unspecified site: Secondary | ICD-10-CM

## 2011-07-13 DIAGNOSIS — C8589 Other specified types of non-Hodgkin lymphoma, extranodal and solid organ sites: Secondary | ICD-10-CM | POA: Insufficient documentation

## 2011-07-15 ENCOUNTER — Other Ambulatory Visit: Payer: Self-pay | Admitting: Oncology

## 2011-07-15 DIAGNOSIS — Z1231 Encounter for screening mammogram for malignant neoplasm of breast: Secondary | ICD-10-CM

## 2011-07-17 ENCOUNTER — Telehealth: Payer: Self-pay | Admitting: *Deleted

## 2011-07-17 ENCOUNTER — Ambulatory Visit (HOSPITAL_BASED_OUTPATIENT_CLINIC_OR_DEPARTMENT_OTHER): Payer: Medicare Other | Admitting: Oncology

## 2011-07-17 VITALS — BP 124/55 | HR 62 | Temp 99.0°F | Ht 63.0 in | Wt 140.8 lb

## 2011-07-17 DIAGNOSIS — C8580 Other specified types of non-Hodgkin lymphoma, unspecified site: Secondary | ICD-10-CM | POA: Insufficient documentation

## 2011-07-17 DIAGNOSIS — C8589 Other specified types of non-Hodgkin lymphoma, extranodal and solid organ sites: Secondary | ICD-10-CM

## 2011-07-17 NOTE — Telephone Encounter (Signed)
Dr. Truett Perna suggests pt NOT get the shingles vaccine. Pt is immunocompromised due to lymphoma. Left message on voicemail for pt to call office.

## 2011-07-17 NOTE — Progress Notes (Signed)
OFFICE PROGRESS NOTE   INTERVAL HISTORY:   Nicole [name] returns as scheduled. She had another parotid infection 2 months ago. The infection was not severe. She was treated with Bactrim and clindamycin. She feels well in general. There's been no recent palpable change in the parotid glands. She continues to have intermittent discomfort at the right anterior chest wall the  Objective:  Vital signs in last 24 hours:  Blood pressure 124/55, pulse 62, temperature 99 F (37.2 C), height 5\' 3"  (1.6 m), weight 140 lb 12.8 oz (63.866 kg).    HEENT: Oropharynx without mass. There is enlargement of the right posterior parotid gland compared to the left side. No discrete mass. No induration or erythema overlying the parotid. Lymphatics: No cervical, supraclavicular, axillary, or inguinal lymph node Resp: Lungs clear bilaterally Cardio: Regular rate and rhythm. 2/6 systolic murmur. GI: Abdomen soft and nontender. No hepatosplenomegaly. Vascular: No leg edema      Studies/Results: A CT of the chest on November 12 was compared to a CT from 04/07/2011. Soft tissue density at the right suprahilar region is unchanged. Infrahilar soft tissue density adjacent to the surgical clips also shows no significant change. No enlarging or new pulmonary nodules or masses the no pulmonary airspace disease or pleural effusion.  Medications: I have reviewed the patient's current medications.  Assessment/Plan: 1. Non-Hodgkin lymphoma, BALT lymphoma diagnosed in 1992.  She underwent a right upper lobectomy, followed by adjuvant CVP chemotherapy in 2000.  2. Medial right lung lesion and right hilar fullness with associated hypermetabolic activity on a PET scan 03/12/2011, status post a bronchoscopy and EBUS-guided biopsy/bronchial washings on 04/13/2011 with nondiagnostic tissue obtained.         -Restaging CT on 07/13/2011 revealed no change in the right perihilar densities compared to the CT from 04/07/2011 with no  evidence for progressive lymphoma. 3. Left parotid gland abscess June 2012, status post an incision and drainage procedure by Dr. Lazarus Salines.  4. History of right greater than left parotid gland enlargement, status post surgery at [facility] for attempted removal of "parotid stones."   a. She underwent a repeat parotid surgery at [facility] a few weeks ago.  b. Salivary gland biopsy at [facility] in August of 2012 confirmed MALT lymphoma. 5. History of positional vertigo.  6. Recurrent parotid gland infections, followed by Dr. Lazarus Salines.    Disposition:  Nicole [name] appear stable. There is no clinical or CT scan evidence for progression of the low-grade lymphoma. We discussed indications for treating the lymphoma. I see no indication to treat her lymphoma unless the recurrent parotid infections are related to MALT lymphoma involving the parotid gland.   Nicole [name] is most comfortable with observation approach for now. She will contact us for enlargement of the parotid glands, recurrent parotid gland infection, or new symptoms. I will recommend rituximab therapy if she needs treatment.  She will return for an office visit and repeat CT of the chest in 6 months. We will recommend against the herpes zoster vaccine due to the underlying lymphoma.   Lucile Shutters, MD  07/17/2011  1:59 PM

## 2011-07-28 ENCOUNTER — Encounter: Payer: Self-pay | Admitting: Internal Medicine

## 2011-08-01 ENCOUNTER — Telehealth: Payer: Self-pay | Admitting: Oncology

## 2011-08-01 NOTE — Telephone Encounter (Signed)
S/w the pt and she is aware of her may appts 

## 2011-08-17 ENCOUNTER — Ambulatory Visit (INDEPENDENT_AMBULATORY_CARE_PROVIDER_SITE_OTHER): Payer: Medicare Other | Admitting: Internal Medicine

## 2011-08-17 ENCOUNTER — Encounter: Payer: Self-pay | Admitting: Internal Medicine

## 2011-08-17 VITALS — BP 126/60 | HR 60 | Ht 63.0 in | Wt 142.4 lb

## 2011-08-17 DIAGNOSIS — K589 Irritable bowel syndrome without diarrhea: Secondary | ICD-10-CM

## 2011-08-17 DIAGNOSIS — Z86010 Personal history of colonic polyps: Secondary | ICD-10-CM

## 2011-08-17 DIAGNOSIS — R1084 Generalized abdominal pain: Secondary | ICD-10-CM

## 2011-08-17 DIAGNOSIS — K625 Hemorrhage of anus and rectum: Secondary | ICD-10-CM

## 2011-08-17 MED ORDER — PEG-KCL-NACL-NASULF-NA ASC-C 100 G PO SOLR: 1.0000 | Freq: Once | ORAL | Status: DC

## 2011-08-17 NOTE — Patient Instructions (Signed)
You have been scheduled for a colonoscopy with propofol. Please follow written instructions given to you at your visit today.  Please pick up your prep kit at the pharmacy within the next 2-3 days.  

## 2011-08-17 NOTE — Progress Notes (Signed)
HISTORY OF PRESENT ILLNESS:  Nicole [name] is a 77 y.o. female with multiple medical problems as listed below. She has been followed in this office for chronic your double bowel syndrome and screening colonoscopies. Prior colonoscopy in 2003 and August 2008 with hyperplastic polyp only. She presents today regarding new onset rectal bleeding. Patient states that she was well until 2 days prior to Thanksgiving and she developed left-sided abdominal pain, described as severe. Subsequently with multiple bowel movements. She did notice blood mixed with the stool and on the tissue paper. Also some clots. The pain eventually resolved after about 5 days. 4 days after the onset, she was evaluated at urgent care and underwent endoscopy. She tells her that they noticed blood, but no obvious cause (she is a Engineer, civil (consulting)). Since surely after that time she has had no problems with abdominal pain or recurrent bleeding. She continues with constipation alternating with diarrhea. Also bloating and nausea. She does have occasional GERD symptoms though does not require PPI. Her chronic medical problems are stable.  REVIEW OF SYSTEMS:  All non-GI ROS negative except for slight hearing problem, heart murmur, short of breath, fatigue  Past Medical History  Diagnosis Date  . Non Hodgkin's lymphoma   . 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 Abscess of parotid gland 01/2011    w/parotid stones  . Positional vertigo   . Sjogren's syndrome   . IBS (irritable bowel syndrome)   . Reflux   . Depression   . Heartburn   . Personal history of colonic polyps 04/13/2007    hyperplastic    Past Surgical History  Procedure Date  . Salivary gland surgery 01/2011 & 04/2011  . Lung removal, partial 04/1999    right lung  . Appendectomy   . Tonsillectomy and adenoidectomy   . Vein surgery 1966  . Cataract extraction 2008 &2010    bilateral  . Iredectomy 2008    left  . Oophorectomy 2000    Social History Nicole [name]  reports that  she has quit smoking. She has never used smokeless tobacco. She reports that she does not drink alcohol or use illicit drugs.  family history includes Celiac disease in her mother; Heart disease in her father and mother; and Kidney cancer in her daughter.  No Known Allergies     PHYSICAL EXAMINATION: Vital signs: BP 126/60  Pulse 60  Ht 5\' 3"  (1.6 m)  Wt 142 lb 6.4 oz (64.592 kg)  BMI 25.22 kg/m2  Constitutional: generally well-appearing, no acute distress Psychiatric: alert and oriented x3, cooperative Eyes: extraocular movements intact, anicteric, conjunctiva pink Mouth: oral pharynx moist, no lesions Neck: supple no lymphadenopathy Cardiovascular: heart regular rate and rhythm, no murmur Lungs: clear to auscultation bilaterally Abdomen: soft, nontender, nondistended, no obvious ascites, no peritoneal signs, normal bowel sounds, no organomegaly. Prior surgical incisions well-healed Rectal: Deferred until colonoscopy Extremities: no lower extremity edema bilaterally Skin: no lesions on visible extremities Neuro: No focal deficits.   ASSESSMENT:  #1. Recent problems with abdominal pain and rectal bleeding. Most likely ischemic colitis. Recently endoscopy demonstrated fresh blood without cause. #2. History of hyperplastic colon polyps. Last colonoscopy August 2008 #3. Irritable bowel syndrome #4. GERD. Mild   PLAN:  #1. Colonoscopy to evaluate rectal bleeding.The nature of the procedure, as well as the risks, benefits, and alternatives were carefully and thoroughly reviewed with the patient. Ample time for discussion and questions allowed. The patient understood, was satisfied, and agreed to proceed. The prep prescribed  was Movi prep. The patient instructed on its use.

## 2011-08-20 ENCOUNTER — Ambulatory Visit
Admission: RE | Admit: 2011-08-20 | Discharge: 2011-08-20 | Disposition: A | Discharge status: Home / Self Care | Payer: Medicare Other | Source: Ambulatory Visit | Attending: Oncology | Admitting: Oncology

## 2011-08-20 DIAGNOSIS — Z1231 Encounter for screening mammogram for malignant neoplasm of breast: Secondary | ICD-10-CM

## 2011-08-24 ENCOUNTER — Encounter: Payer: Self-pay | Admitting: Internal Medicine

## 2011-09-08 ENCOUNTER — Encounter: Payer: Self-pay | Admitting: Internal Medicine

## 2011-09-08 ENCOUNTER — Ambulatory Visit (AMBULATORY_SURGERY_CENTER): Payer: Medicare Other | Admitting: Internal Medicine

## 2011-09-08 VITALS — BP 132/61 | HR 60 | Temp 97.0°F | Resp 16 | Ht 63.0 in | Wt 142.0 lb

## 2011-09-08 DIAGNOSIS — K625 Hemorrhage of anus and rectum: Secondary | ICD-10-CM

## 2011-09-08 DIAGNOSIS — Z86010 Personal history of colonic polyps: Secondary | ICD-10-CM

## 2011-09-08 DIAGNOSIS — K635 Polyp of colon: Secondary | ICD-10-CM

## 2011-09-08 DIAGNOSIS — Z1211 Encounter for screening for malignant neoplasm of colon: Secondary | ICD-10-CM

## 2011-09-08 DIAGNOSIS — D126 Benign neoplasm of colon, unspecified: Secondary | ICD-10-CM

## 2011-09-08 DIAGNOSIS — R1084 Generalized abdominal pain: Secondary | ICD-10-CM

## 2011-09-08 MED ORDER — SODIUM CHLORIDE 0.9 % IV SOLN: 500.0000 mL | INTRAVENOUS | Status: DC

## 2011-09-08 NOTE — Progress Notes (Signed)
Patient did not experience any of the following events: a burn prior to discharge; a fall within the facility; wrong site/side/patient/procedure/implant event; or a hospital transfer or hospital admission upon discharge from the facility. (G8907) Patient did not have preoperative order for IV antibiotic SSI prophylaxis. (G8918)  

## 2011-09-08 NOTE — Op Note (Signed)
[facility] [address] [place], [place]  [address]  COLONOSCOPY PROCEDURE REPORT  PATIENT:  Nicole [name], Nicole [name]  MR#:  [id number] BIRTHDATE:  1932/12/04, 78 yrs. old  GENDER:  female ENDOSCOPIST:  Wilhemina Bonito. Eda Keys, MD REF. BY:  Office PROCEDURE DATE:  09/08/2011 PROCEDURE:  Colonoscopy with snare polypectomy x 3 ASA CLASS:  Class II INDICATIONS:  Hx polyps (HPP 2003; 2008); screening; rectal bleeding, Abdominal pain ; anoscopy w/ blood w/o source identified  MEDICATIONS:   MAC sedation, administered by CRNA, propofol (Diprivan) 400 mg IV  DESCRIPTION OF PROCEDURE:   After the risks benefits and alternatives of the procedure were thoroughly explained, informed consent was obtained.  Digital rectal exam was performed and revealed no abnormalities.   The LB CF-H180AL [id number] endoscope was introduced through the anus and advanced to the cecum, which was identified by both the appendix and ileocecal valve, without limitations.  The quality of the prep was excellent, using MoviPrep.  The instrument was then slowly withdrawn as the colon was fully examined. <<PROCEDUREIMAGES>>  FINDINGS: Fixed rectosigmoid region requiring peds colonoscope to negotiate. Three 6mm sessile polyps were found in the cecum and ascending colon (2). Polyps were snared without cautery. Retrieval was successful. Otherwise normal colonoscopy without other polyps, masses, vascular ectasias, or inflammatory changes.   Retroflexed views in the rectum revealed no abnormalities.    The time to cecum = 10:10  minutes. The scope was then withdrawn in 11:00 minutes from the cecum and the procedure completed. COMPLICATIONS:  None  ENDOSCOPIC IMPRESSION: 1) Three polyps - removed 2) Otherwise normal colonoscopy 3) bleeding and pain likely due to transient bout of ischemic colitis  RECOMMENDATIONS: 1) Return to the care of your primary provider. GI follow up  as needed  ______________________________ Wilhemina Bonito. Eda Keys, MD  CC:  Laurann Montana, MD;  The Patient  n. eSIGNED:   Wilhemina Bonito. Eda Keys at 09/08/2011 02:25 PM  Arnetha Massy, [phone]

## 2011-09-08 NOTE — Patient Instructions (Signed)
Please refer to blue and green discharge instruction sheet, report, and educational materials.  Polyps removed today.  When pathology returns Dr.Perry will mail you a letter with results and to confirm if and when you may need another colonoscopy.  If you do not receive this letter within 3 weeks, please call our office [phone].

## 2011-09-09 ENCOUNTER — Telehealth: Payer: Self-pay | Admitting: *Deleted

## 2011-09-09 NOTE — Telephone Encounter (Signed)
Follow up Call- Patient questions:  Do you have a fever, pain , or abdominal swelling? no Pain Score  0 *  Have you tolerated food without any problems? yes  Have you been able to return to your normal activities? yes  Do you have any questions about your discharge instructions: Diet   no Medications  no Follow up visit  no  Do you have questions or concerns about your Care? no  Actions: * If pain score is 4 or above: No action needed, pain <4. Patient stating the Propofol was great and the nurse starting her IV was wonderful, no pain.

## 2011-09-14 ENCOUNTER — Encounter: Payer: Self-pay | Admitting: Internal Medicine

## 2011-10-02 ENCOUNTER — Other Ambulatory Visit: Payer: Self-pay | Admitting: *Deleted

## 2011-10-16 ENCOUNTER — Other Ambulatory Visit: Payer: Self-pay | Admitting: *Deleted

## 2011-10-19 ENCOUNTER — Telehealth: Payer: Self-pay | Admitting: Oncology

## 2011-10-19 NOTE — Telephone Encounter (Signed)
called pt lmovm that we cancelled her lab appt on 01/08/2012

## 2011-12-07 ENCOUNTER — Telehealth: Payer: Self-pay | Admitting: Oncology

## 2011-12-07 NOTE — Telephone Encounter (Signed)
pt rtn call and confirmed appt for 05/24

## 2011-12-07 NOTE — Telephone Encounter (Signed)
pt called lmovm to r/s appt on 05/16 to the following week. rtn call to pt lmovm for appt d/t for 05/24

## 2012-01-08 ENCOUNTER — Other Ambulatory Visit: Payer: Medicare Other | Admitting: Lab

## 2012-01-11 ENCOUNTER — Other Ambulatory Visit (HOSPITAL_COMMUNITY): Payer: Medicare Other

## 2012-01-14 ENCOUNTER — Ambulatory Visit: Payer: Medicare Other | Admitting: Oncology

## 2012-01-14 ENCOUNTER — Ambulatory Visit: Payer: Self-pay | Admitting: Oncology

## 2012-01-19 ENCOUNTER — Ambulatory Visit (HOSPITAL_COMMUNITY)
Admission: RE | Admit: 2012-01-19 | Discharge: 2012-01-19 | Disposition: A | Discharge status: Home / Self Care | Payer: Medicare Other | Source: Ambulatory Visit | Attending: Oncology | Admitting: Oncology

## 2012-01-19 ENCOUNTER — Encounter (HOSPITAL_COMMUNITY): Payer: Self-pay

## 2012-01-19 DIAGNOSIS — R05 Cough: Secondary | ICD-10-CM | POA: Insufficient documentation

## 2012-01-19 DIAGNOSIS — R0602 Shortness of breath: Secondary | ICD-10-CM | POA: Insufficient documentation

## 2012-01-19 DIAGNOSIS — J984 Other disorders of lung: Secondary | ICD-10-CM | POA: Insufficient documentation

## 2012-01-19 DIAGNOSIS — R059 Cough, unspecified: Secondary | ICD-10-CM | POA: Insufficient documentation

## 2012-01-19 DIAGNOSIS — Z902 Acquired absence of lung [part of]: Secondary | ICD-10-CM | POA: Insufficient documentation

## 2012-01-19 DIAGNOSIS — C8589 Other specified types of non-Hodgkin lymphoma, extranodal and solid organ sites: Secondary | ICD-10-CM | POA: Insufficient documentation

## 2012-01-22 ENCOUNTER — Ambulatory Visit: Payer: Self-pay | Admitting: Oncology

## 2012-01-22 ENCOUNTER — Telehealth: Payer: Self-pay | Admitting: Oncology

## 2012-01-22 NOTE — Telephone Encounter (Signed)
per Dr. Truett Perna called pt and r/s appt for 05/24 to 06/05

## 2012-02-03 ENCOUNTER — Ambulatory Visit (HOSPITAL_BASED_OUTPATIENT_CLINIC_OR_DEPARTMENT_OTHER): Payer: Medicare Other | Admitting: Oncology

## 2012-02-03 ENCOUNTER — Telehealth: Payer: Self-pay | Admitting: Oncology

## 2012-02-03 VITALS — BP 110/52 | HR 57 | Temp 97.6°F | Ht 63.0 in | Wt 141.4 lb

## 2012-02-03 DIAGNOSIS — C8589 Other specified types of non-Hodgkin lymphoma, extranodal and solid organ sites: Secondary | ICD-10-CM

## 2012-02-03 NOTE — Telephone Encounter (Signed)
appts made and printed for pt aom °

## 2012-02-03 NOTE — Progress Notes (Signed)
   [facility]    OFFICE PROGRESS NOTE   INTERVAL HISTORY:   She returns as scheduled. She is currently completing a course of antibiotics for an infection at the right parotid. She continues to have recurrent parotid infections that respond to antibiotics. No fever or sweats. Stable mild exertional dyspnea. No palpable lymph nodes.  Objective:  Vital signs in last 24 hours:  Blood pressure 110/52, pulse 57, temperature 97.6 F (36.4 C), temperature source Oral, height 5\' 3"  (1.6 m), weight 141 lb 6.4 oz (64.139 kg).    HEENT: There is enlargement of the right greater than left parotid. Oropharynx without mass, neck without mass Lymphatics: No cervical, supraclavicular, axillary, or inguinal nodes. Prominent left axillary fat pad. Resp: Lungs clear bilaterally with bronchial sounds at the right upper posterior chest, no respiratory distress Cardio: Regular rate and rhythm GI: No hepatosplenomegaly Vascular: No leg edema  X-rays: A CT the chest on may 21st 2013 was compared to a CT from 07/13/2011. There is right greater than left. Bronchovascular nodularity that is minimally progressive since November 2012. Confluent opacity in the posterior right upper lung measures 1.4 x 0.9 cm compared to 1.3 x 0.8 cm on the previous study. Confluence soft tissue at the right hilum measures 1.8 x 2.4 cm compared to 1.7 x 1.9 cm. No pericardial or pleural effusion. No mediastinal or hilar adenopathy.  Lab Results:  Lab Results  Component Value Date   WBC 9.6 04/09/2011   HGB 13.9 04/09/2011   HCT 41.1 04/09/2011   MCV 86.9 04/09/2011   PLT 312 04/09/2011      Medications: I have reviewed the patient's current medications.  Assessment/Plan: 1. Non-Hodgkin lymphoma, BALT lymphoma diagnosed in 1992. She underwent a right upper lobectomy, followed by adjuvant CVP chemotherapy in 2000.  2. Medial right lung lesion and right hilar fullness with associated hypermetabolic activity on a PET  scan 03/12/2011, status post a bronchoscopy and EBUS-guided biopsy/bronchial washings on 04/13/2011 with nondiagnostic tissue obtained. -Restaging CT on 07/13/2011 revealed no change in the right perihilar densities compared to the CT from 04/07/2011 with no evidence for progressive lymphoma. A restaging CT 01/19/12 revealed a slight increase in the perihilar density and bronchovascular nodularity compared to November of 2012. 3. Left parotid gland abscess June 2012, status post an incision and drainage procedure by Dr. Lazarus Salines.  4. History of right greater than left parotid gland enlargement, status post surgery at [facility] for attempted removal of "parotid stones."  a. Salivary gland biopsy at [facility] in August of 2012 confirmed MALT lymphoma. 5. History of positional vertigo.  6. Recurrent parotid gland infections, followed by Dr. Lazarus Salines. 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 She appears asymptomatic from the lymphoma unless the parotid infections are related to underlying lymphoma. I discussed treatment options with Ms. Dyk. She is most comfortable with a continued observation approach. She will return for an office visit and CT of the chest in 6 months. Ms. Bjelland knows to contact us in the interim for new symptoms. The parotid gland infections have repeatedly responded to antibiotics. We will consider systemic chemotherapy if her parotid symptoms do not respond to antibiotics.   Thornton Papas, MD  02/03/2012  4:38 PM

## 2012-04-25 ENCOUNTER — Other Ambulatory Visit: Payer: Self-pay | Admitting: Dermatology

## 2012-06-07 ENCOUNTER — Other Ambulatory Visit: Payer: Self-pay | Admitting: Dermatology

## 2012-06-09 ENCOUNTER — Telehealth: Payer: Self-pay | Admitting: *Deleted

## 2012-06-09 NOTE — Telephone Encounter (Signed)
More short of breath during activity over past month or so. 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 Had a "bad cold" in August with NP cough. Cough continues, but has improved. Daughter thinks she sounds "raspy" when she inhales. Scheduled for follow up CT scan in November.

## 2012-06-10 ENCOUNTER — Telehealth: Payer: Self-pay | Admitting: *Deleted

## 2012-06-10 NOTE — Telephone Encounter (Signed)
Notified patient she may have CT scan this month instead of November if she prefers, per MD. She would like scan this month. Scheduler notified and managed care will f/u on precert need.

## 2012-06-29 ENCOUNTER — Ambulatory Visit: Payer: Medicare Other | Attending: Family Medicine | Admitting: *Deleted

## 2012-06-29 DIAGNOSIS — H8110 Benign paroxysmal vertigo, unspecified ear: Secondary | ICD-10-CM | POA: Insufficient documentation

## 2012-06-29 DIAGNOSIS — R269 Unspecified abnormalities of gait and mobility: Secondary | ICD-10-CM | POA: Insufficient documentation

## 2012-06-29 DIAGNOSIS — IMO0001 Reserved for inherently not codable concepts without codable children: Secondary | ICD-10-CM | POA: Insufficient documentation

## 2012-07-01 ENCOUNTER — Ambulatory Visit: Payer: Medicare Other | Attending: Family Medicine | Admitting: *Deleted

## 2012-07-01 DIAGNOSIS — H8110 Benign paroxysmal vertigo, unspecified ear: Secondary | ICD-10-CM | POA: Insufficient documentation

## 2012-07-01 DIAGNOSIS — R269 Unspecified abnormalities of gait and mobility: Secondary | ICD-10-CM | POA: Insufficient documentation

## 2012-07-01 DIAGNOSIS — IMO0001 Reserved for inherently not codable concepts without codable children: Secondary | ICD-10-CM | POA: Insufficient documentation

## 2012-07-12 ENCOUNTER — Ambulatory Visit: Payer: Medicare Other | Admitting: *Deleted

## 2012-07-14 ENCOUNTER — Ambulatory Visit: Payer: Medicare Other | Admitting: *Deleted

## 2012-07-20 ENCOUNTER — Ambulatory Visit: Payer: Medicare Other | Admitting: Rehabilitative and Restorative Service Providers"

## 2012-07-21 ENCOUNTER — Other Ambulatory Visit: Payer: Self-pay | Admitting: Family Medicine

## 2012-07-21 DIAGNOSIS — Z1231 Encounter for screening mammogram for malignant neoplasm of breast: Secondary | ICD-10-CM

## 2012-07-22 ENCOUNTER — Ambulatory Visit: Payer: Medicare Other | Admitting: Rehabilitative and Restorative Service Providers"

## 2012-07-25 ENCOUNTER — Ambulatory Visit (HOSPITAL_COMMUNITY)
Admission: RE | Admit: 2012-07-25 | Discharge: 2012-07-25 | Disposition: A | Discharge status: Home / Self Care | Payer: Medicare Other | Source: Ambulatory Visit | Attending: Oncology | Admitting: Oncology

## 2012-07-25 DIAGNOSIS — Z902 Acquired absence of lung [part of]: Secondary | ICD-10-CM | POA: Insufficient documentation

## 2012-07-25 DIAGNOSIS — C8589 Other specified types of non-Hodgkin lymphoma, extranodal and solid organ sites: Secondary | ICD-10-CM

## 2012-07-25 DIAGNOSIS — Z9221 Personal history of antineoplastic chemotherapy: Secondary | ICD-10-CM | POA: Insufficient documentation

## 2012-07-26 ENCOUNTER — Telehealth: Payer: Self-pay | Admitting: Oncology

## 2012-07-26 ENCOUNTER — Ambulatory Visit (HOSPITAL_BASED_OUTPATIENT_CLINIC_OR_DEPARTMENT_OTHER): Payer: Medicare Other | Admitting: Oncology

## 2012-07-26 VITALS — BP 145/50 | HR 61 | Temp 97.4°F | Resp 18 | Ht 63.0 in | Wt 141.4 lb

## 2012-07-26 DIAGNOSIS — C8589 Other specified types of non-Hodgkin lymphoma, extranodal and solid organ sites: Secondary | ICD-10-CM

## 2012-07-26 NOTE — Progress Notes (Signed)
[facility]    OFFICE PROGRESS NOTE   INTERVAL HISTORY:   She returns as scheduled. She has noted a recent increase in exertional dyspnea. She does not have dyspnea with usual activities. She is undergoing a cardiology evaluation. She has "soreness "at the right anterior chest wall deep to the medial aspect of the right upper breast. No palpable abnormality in the breast.  No fever, night sweats, or anorexia. No recent parotid infection. She reports recent removal of 2 basal cell carcinomas from the right side of the face. Objective:  Vital signs in last 24 hours:  Blood pressure 145/50, pulse 61, temperature 97.4 F (36.3 C), temperature source Oral, resp. rate 18, height 5\' 3"  (1.6 m), weight 141 lb 6.4 oz (64.139 kg).    HEENT: There is prominence of the right greater than left parotid, oropharynx without visible mass, the right sublingual gland is more prominent than the left side. Neck without mass Lymphatics: No cervical, supraclavicular, axillary, or inguinal nodes Resp: Lungs clear bilaterally Cardio: Regular rate and rhythm GI: No hepatosplenomegaly Vascular: No leg edema Musculoskeletal: There is tenderness over the chest wall at the mid aspect of the medial right breast.? 1 cm cystic area deep in the breast tissue overlying the rib. I cannot palpate a discrete rib mass.    X-rays: A CT the chest on 07/25/2012 was compared to a CT from 01/19/2012-perihilar soft tissue thickening adjacent to the right middle lobe bronchus is not increased. No new nodularity. Smaller diffuse nodularity within the right lung anterior to the hilum is stable. Similar findings in the lingula and lower lobe are unchanged. No lymphadenopathy. Normal spleen.     Medications: I have reviewed the patient's current medications.  Assessment/Plan: 1. Non-Hodgkin lymphoma, BALT lymphoma diagnosed in 1992. She underwent a right upper lobectomy, followed by adjuvant CVP chemotherapy in  2000.  2. Medial right lung lesion and right hilar fullness with associated hypermetabolic activity on a PET scan 03/12/2011, status post a bronchoscopy and EBUS-guided biopsy/bronchial washings on 04/13/2011 with nondiagnostic tissue obtained. -Restaging CT on 07/13/2011 revealed no change in the right perihilar densities compared to the CT from 04/07/2011 with no evidence for progressive lymphoma. A restaging CT 01/19/12 revealed a slight increase in the perihilar density and bronchovascular nodularity compared to November of 2012. Restaging CT 07/25/2012 revealed stable stable nodular thickening in the right hilum and stable fine nodularity in the right and left lung. No evidence of progressive disease. 3. Left parotid gland abscess June 2012, status post an incision and drainage procedure by Dr. Lazarus Salines.  4. History of right greater than left parotid gland enlargement, status post surgery at [facility] for attempted removal of "parotid stones."  a. Salivary gland biopsy at [facility] in August of 2012 confirmed MALT lymphoma. 5. History of positional vertigo.  6. Recurrent parotid gland infections, followed by Dr. Lazarus Salines. She reports no infection since May of 2013 7. Exertional dyspnea--most likely unrelated to the non-Hodgkin's lymphoma. She is undergoing an evaluation at [facility] cardiology. 8. Tenderness at the right medial anterior chest wall/deep breast-likely a benign musculoskeletal condition, she will ask the radiologist to focus on this area when she has a mammogram next month. She will contact us for a palpable change.   Disposition:  She has MALT lymphoma. The CT of the chest reveals no evidence of progressive disease and the physical exam findings are unchanged today. The plan is to continue an observation approach. She will return for an office visit and  restaging CT of the chest in 6 months.  Ms. Fennimore will contact us in the interim for new symptoms. We will consider systemic therapy for lymphoma if  she develops clinical evidence of disease progression.   Thornton Papas, MD  07/26/2012  10:50 AM

## 2012-07-26 NOTE — Telephone Encounter (Signed)
appt made for pt,pt req that i write her appt on her papers/done    anne

## 2012-08-01 ENCOUNTER — Encounter: Payer: Medicare Other | Admitting: Rehabilitative and Restorative Service Providers"

## 2012-08-03 ENCOUNTER — Ambulatory Visit: Payer: Medicare Other | Attending: Family Medicine | Admitting: Rehabilitative and Restorative Service Providers"

## 2012-08-03 DIAGNOSIS — R269 Unspecified abnormalities of gait and mobility: Secondary | ICD-10-CM | POA: Insufficient documentation

## 2012-08-03 DIAGNOSIS — H8110 Benign paroxysmal vertigo, unspecified ear: Secondary | ICD-10-CM | POA: Insufficient documentation

## 2012-08-03 DIAGNOSIS — IMO0001 Reserved for inherently not codable concepts without codable children: Secondary | ICD-10-CM | POA: Insufficient documentation

## 2012-08-05 ENCOUNTER — Encounter: Payer: Medicare Other | Admitting: Rehabilitative and Restorative Service Providers"

## 2012-08-30 ENCOUNTER — Ambulatory Visit
Admission: RE | Admit: 2012-08-30 | Discharge: 2012-08-30 | Disposition: A | Discharge status: Home / Self Care | Payer: Medicare Other | Source: Ambulatory Visit | Attending: Family Medicine | Admitting: Family Medicine

## 2012-08-30 ENCOUNTER — Other Ambulatory Visit: Payer: Self-pay | Admitting: Oncology

## 2012-08-30 DIAGNOSIS — Z1231 Encounter for screening mammogram for malignant neoplasm of breast: Secondary | ICD-10-CM

## 2012-08-30 DIAGNOSIS — N644 Mastodynia: Secondary | ICD-10-CM

## 2012-09-16 ENCOUNTER — Telehealth: Payer: Self-pay | Admitting: *Deleted

## 2012-09-16 NOTE — Telephone Encounter (Signed)
Notified patient that Dr Truett Perna signed and faxed the form she requested to [facility].

## 2012-09-22 ENCOUNTER — Telehealth: Payer: Self-pay | Admitting: Medical Oncology

## 2012-09-22 NOTE — Telephone Encounter (Signed)
Order faxed.

## 2012-09-23 ENCOUNTER — Encounter: Payer: Self-pay | Admitting: *Deleted

## 2012-10-03 ENCOUNTER — Ambulatory Visit
Admission: RE | Admit: 2012-10-03 | Discharge: 2012-10-03 | Disposition: A | Discharge status: Home / Self Care | Payer: Medicare Other | Source: Ambulatory Visit | Attending: Oncology | Admitting: Oncology

## 2012-10-03 DIAGNOSIS — N644 Mastodynia: Secondary | ICD-10-CM

## 2012-10-14 ENCOUNTER — Telehealth: Payer: Self-pay | Admitting: Oncology

## 2012-10-14 NOTE — Telephone Encounter (Signed)
Pt called and r/s appt to 5/30 MD only

## 2012-10-27 DIAGNOSIS — H059 Unspecified disorder of orbit: Secondary | ICD-10-CM | POA: Insufficient documentation

## 2012-11-02 ENCOUNTER — Telehealth: Payer: Self-pay | Admitting: *Deleted

## 2012-11-02 ENCOUNTER — Other Ambulatory Visit: Payer: Self-pay | Admitting: *Deleted

## 2012-11-02 ENCOUNTER — Telehealth: Payer: Self-pay | Admitting: Oncology

## 2012-11-02 NOTE — Telephone Encounter (Signed)
Gave pt appt for lab only on 12/05/12

## 2012-11-02 NOTE — Telephone Encounter (Signed)
Called to report her biopsy is on 11/14/12 at 2:30pm after MRI completed.  POF to scheduler for appointment after this per MD order.

## 2012-11-02 NOTE — Telephone Encounter (Signed)
Left VM for patient to call and provide date of her conjunctival biopsy at [facility]. Dr. Truett Perna would like to see her after this is completed and results are received.

## 2012-11-29 ENCOUNTER — Telehealth: Payer: Self-pay | Admitting: *Deleted

## 2012-11-29 NOTE — Telephone Encounter (Signed)
Called to cancel her OV on 4/7 since she will be having chemo at [facility] for new diagnosis of lymphoma of eye. Has CT scan 4/3. Had bone marrow biopsy today. Dr. Truett Perna notified and made her aware we are here for her if needed. Have [facility] call if they need [place] to see her or do any lab studies.

## 2012-12-05 ENCOUNTER — Ambulatory Visit: Payer: Medicare Other | Admitting: Oncology

## 2013-01-17 DIAGNOSIS — R05 Cough: Secondary | ICD-10-CM | POA: Insufficient documentation

## 2013-01-17 DIAGNOSIS — R053 Chronic cough: Secondary | ICD-10-CM | POA: Insufficient documentation

## 2013-01-17 DIAGNOSIS — R9389 Abnormal findings on diagnostic imaging of other specified body structures: Secondary | ICD-10-CM | POA: Insufficient documentation

## 2013-01-24 ENCOUNTER — Other Ambulatory Visit (HOSPITAL_COMMUNITY): Payer: Medicare Other

## 2013-01-26 ENCOUNTER — Ambulatory Visit: Payer: Medicare Other | Admitting: Oncology

## 2013-01-27 ENCOUNTER — Ambulatory Visit: Payer: Medicare Other | Admitting: Oncology

## 2013-04-05 ENCOUNTER — Other Ambulatory Visit: Payer: Self-pay

## 2013-07-06 ENCOUNTER — Other Ambulatory Visit: Payer: Self-pay

## 2013-09-12 ENCOUNTER — Other Ambulatory Visit: Payer: Self-pay

## 2013-09-12 DIAGNOSIS — Z1231 Encounter for screening mammogram for malignant neoplasm of breast: Secondary | ICD-10-CM

## 2013-10-04 ENCOUNTER — Ambulatory Visit
Admission: RE | Admit: 2013-10-04 | Discharge: 2013-10-04 | Disposition: A | Discharge status: Home / Self Care | Payer: Self-pay | Source: Ambulatory Visit

## 2013-10-04 DIAGNOSIS — Z1231 Encounter for screening mammogram for malignant neoplasm of breast: Secondary | ICD-10-CM

## 2013-10-19 ENCOUNTER — Telehealth: Payer: Self-pay | Admitting: *Deleted

## 2013-10-19 NOTE — Telephone Encounter (Signed)
Patient requests metoprolol refill to be sent to [facility] on [address]. Thanks, [place]

## 2013-10-23 ENCOUNTER — Telehealth: Payer: Self-pay | Admitting: *Deleted

## 2013-10-23 ENCOUNTER — Other Ambulatory Visit: Payer: Self-pay | Admitting: Cardiology

## 2013-10-23 DIAGNOSIS — C8589 Other specified types of non-Hodgkin lymphoma, extranodal and solid organ sites: Secondary | ICD-10-CM

## 2013-10-23 MED ORDER — METOPROLOL SUCCINATE ER 25 MG PO TB24: 25.0000 mg | ORAL_TABLET | Freq: Every day | ORAL | Status: DC

## 2013-10-23 NOTE — Telephone Encounter (Signed)
Rx filled

## 2013-10-23 NOTE — Telephone Encounter (Signed)
Patient requests metoprolol refill to be sent to [facility] on [address]. She is out of medication. Thanks, MI

## 2013-10-31 NOTE — Telephone Encounter (Signed)
Rx filled on 10/23/2013

## 2013-11-03 ENCOUNTER — Other Ambulatory Visit: Payer: Self-pay | Admitting: Cardiology

## 2013-11-03 DIAGNOSIS — C8589 Other specified types of non-Hodgkin lymphoma, extranodal and solid organ sites: Secondary | ICD-10-CM

## 2013-11-03 MED ORDER — METOPROLOL SUCCINATE ER 25 MG PO TB24: 25.0000 mg | ORAL_TABLET | Freq: Every day | ORAL | Status: DC

## 2014-03-28 ENCOUNTER — Encounter: Payer: Self-pay | Admitting: Cardiology

## 2014-03-28 DIAGNOSIS — IMO0001 Reserved for inherently not codable concepts without codable children: Secondary | ICD-10-CM | POA: Insufficient documentation

## 2014-03-28 DIAGNOSIS — K219 Gastro-esophageal reflux disease without esophagitis: Secondary | ICD-10-CM

## 2014-03-28 DIAGNOSIS — K589 Irritable bowel syndrome without diarrhea: Secondary | ICD-10-CM | POA: Insufficient documentation

## 2014-03-28 DIAGNOSIS — F32A Depression, unspecified: Secondary | ICD-10-CM | POA: Insufficient documentation

## 2014-03-28 DIAGNOSIS — M3500 Sicca syndrome, unspecified: Secondary | ICD-10-CM | POA: Insufficient documentation

## 2014-03-28 DIAGNOSIS — IMO0002 Reserved for concepts with insufficient information to code with codable children: Secondary | ICD-10-CM | POA: Insufficient documentation

## 2014-03-28 DIAGNOSIS — F329 Major depressive disorder, single episode, unspecified: Secondary | ICD-10-CM | POA: Insufficient documentation

## 2014-04-12 ENCOUNTER — Ambulatory Visit: Payer: Medicare Other | Admitting: Cardiology

## 2014-04-16 ENCOUNTER — Encounter: Payer: Self-pay | Admitting: *Deleted

## 2014-04-25 ENCOUNTER — Encounter: Payer: Self-pay | Admitting: Cardiology

## 2014-04-25 ENCOUNTER — Ambulatory Visit (INDEPENDENT_AMBULATORY_CARE_PROVIDER_SITE_OTHER): Payer: Medicare Other | Admitting: Cardiology

## 2014-04-25 VITALS — BP 124/64 | HR 74 | Ht 63.5 in | Wt 150.0 lb

## 2014-04-25 DIAGNOSIS — I5032 Chronic diastolic (congestive) heart failure: Secondary | ICD-10-CM | POA: Insufficient documentation

## 2014-04-25 DIAGNOSIS — E781 Pure hyperglyceridemia: Secondary | ICD-10-CM | POA: Insufficient documentation

## 2014-04-25 DIAGNOSIS — I10 Essential (primary) hypertension: Secondary | ICD-10-CM

## 2014-04-25 NOTE — Progress Notes (Signed)
[place]. [address] [place], [place]  [address] Phone: [phone] Fax:  [phone]  Date:  04/25/2014   ID:  Nicole [name], Nicole [name] 1933/04/10, MRN [id number]  PCP:  Nicole Schwalbe, MD   History of Present Illness: Nicole [name] is a 80 y.o. female with hypertriglyceridemia, diastolic dysfunction, parotid gland disease. Diastolic dysfunction-on low-dose metoprolol. Prior uphill SOB has increased, moderate in severity, relieved with rest, no associated symptoms of chest discomfort or heaviness. CT of chest showed 2 small areas of concern. Dr. Arlyce Dice Perform surgery,monitored. Dr. Ammie Dalton.   She has had hypertriglyceridemia. This is improved significantly with fish oil. Down into the 150-190 range.  Traveling often. [place] trips. [place]. Enjoying time with her friend Nicole [name]. Mostly when on [place].   Diagnosed with lymphoma. Eye. 5 chemo tx. Rituxan. [facility].   Noted SOB with inclines. No CP.     Wt Readings from Last 3 Encounters:  04/25/14 150 lb (68.04 kg)  07/26/12 141 lb 6.4 oz (64.139 kg)  02/03/12 141 lb 6.4 oz (64.139 kg)     Past Medical History  Diagnosis Date  . Non Hodgkin's lymphoma   . Abscess of parotid gland 01/2011    w/parotid stones  . Positional vertigo   . Sjogren's syndrome   . IBS (irritable bowel syndrome)   . Reflux   . Depression   . Heartburn   . Personal history of colonic polyps 04/13/2007    hyperplastic  . 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 Salivary gland surgery  01/2011 & 04/2011  . Lung removal, partial  04/1999    right lung  . Appendectomy    . Tonsillectomy and adenoidectomy    . Vein surgery  1966  . Cataract extraction  2008 &2010    bilateral  . Iredectomy  2008    left  . Oophorectomy  2000    Current Outpatient Prescriptions  Medication Sig Dispense Refill  . aspirin 81 MG tablet Take 81 mg by mouth daily.        .  Calcium Carbonate-Vitamin D (CALCIUM-VITAMIN D) 600-200 MG-UNIT CAPS Take 1 tablet by mouth daily.       . Cholecalciferol (VITAMIN D3) 1000 UNITS CAPS Take 1,000 Units by mouth daily.        Marland Kitchen estrogens, conjugated, (PREMARIN) 0.625 MG tablet Take 0.625 mg by mouth daily.       . Flaxseed, Linseed, (FLAX SEED OIL) 1000 MG CAPS Take 1,000 mg by mouth daily.        Marland Kitchen glucosamine-chondroitin (GLUCOSAMINE-CHONDROITIN DS) 500-400 MG tablet 1 tablet 2 (two) times daily.       . Methylsulfonylmethane (MSM) 1000 MG CAPS Take 1,000 mg by mouth daily.        . metoprolol succinate (TOPROL-XL) 25 MG 24 hr tablet Take 1 tablet (25 mg total) by mouth daily.  30 tablet  5  . metroNIDAZOLE (FLAGYL) 500 MG tablet       . Multiple Vitamin (MULTIVITAMIN) capsule Take 1 capsule by mouth daily.        . OMEGA 3 1000 MG CAPS Take 1,000 mg by mouth 2 (two) times daily.        [name] Glycol-Propyl Glycol (SYSTANE OP) Apply 1 drop to eye as needed. Both eyes      . riTUXimab (RITUXAN) 10 MG/ML injection Infuse into the  vein. Infusions every 3 months with Tylenol, Prednisone and Benadryl      . vitamin C (ASCORBIC ACID) 500 MG tablet Take 500 mg by mouth daily.         No current facility-administered medications for this visit.    Allergies:    Allergies  Allergen Reactions  . Ace Inhibitors     cough  . Protonix [Pantoprazole Sodium]     Dizziness    Social History:  The patient  reports that she has quit smoking. She has never used smokeless tobacco. She reports that she does not drink alcohol or use illicit drugs.   Family History  Problem Relation Age of Onset  . Celiac disease Mother   . Heart disease Mother   . Heart disease Father   . Heart failure Father   . Kidney cancer Daughter     ROS:  Please see the history of present illness.  Fingernail splitting. Hair loss-possible chemotherapy side effect Positive for shortness of breath, chronic. No fevers, chills, orthopnea, PND   All other  systems reviewed and negative.   PHYSICAL EXAM: VS:  BP 124/64  Pulse 74  Ht 5' 3.5" (1.613 m)  Wt 150 lb (68.04 kg)  BMI 26.15 kg/m2 Well nourished, well developed, in no acute distress HEENT: normal, [place]/AT, EOMI Neck: no JVD, normal carotid upstroke, no bruit Cardiac:  normal S1, S2; RRR; no murmur Lungs:  clear to auscultation bilaterally, no wheezing, rhonchi or rales Abd: soft, nontender, no hepatomegaly, no bruits Ext: no edema, 2+ distal pulses Skin: warm and dry GU: deferred Neuro: no focal abnormalities noted, AAO x 3  EKG:  04/25/14-sinus rhythm, no other abnormalities, baseline artifact. ECHO: 07/21/12 - normal ejection fraction, trace MR, diastolic dysfunction grade 1 NUC: 07/21/12 - low risk, no ischemia, normal EF.  ASSESSMENT AND PLAN:  1. Chronic diastolic heart failure-symptoms of shortness of breath has been chronic. Fairly well controlled. Echocardiogram reassuring. Multifactorial also from deconditioning. Continue with metoprolol. Could consider low-dose diuretic at some point. She does have to drink several liters of fluid during her chemotherapy sessions. Continue to monitor. Previous nuclear stress test reassuring, echocardiogram reassuring. CT scan of chest reassuring with no parenchymal lung disease. No pericardial effusion. 2. Hypertriglyceridemia-previous excellent response to fish oil. 3. Hypertension-well-controlled. Medications reviewed. 4. One-year followup.  Signed, Candee Furbish, MD [facility]  04/25/2014 9:26 AM

## 2014-04-25 NOTE — Patient Instructions (Signed)
The current medical regimen is effective;  continue present plan and medications.  Follow up in 1 year with Dr Skains.  You will receive a letter in the mail 2 months before you are due.  Please call us when you receive this letter to schedule your follow up appointment.  

## 2014-05-09 ENCOUNTER — Encounter: Payer: Self-pay | Admitting: Cardiology

## 2014-09-12 ENCOUNTER — Other Ambulatory Visit: Payer: Self-pay

## 2014-09-12 DIAGNOSIS — Z1231 Encounter for screening mammogram for malignant neoplasm of breast: Secondary | ICD-10-CM

## 2014-10-03 ENCOUNTER — Other Ambulatory Visit: Payer: Self-pay

## 2014-10-03 DIAGNOSIS — I5032 Chronic diastolic (congestive) heart failure: Secondary | ICD-10-CM

## 2014-10-03 MED ORDER — METOPROLOL SUCCINATE ER 25 MG PO TB24: 25.0000 mg | ORAL_TABLET | Freq: Every day | ORAL | Status: DC

## 2014-10-05 ENCOUNTER — Ambulatory Visit
Admission: RE | Admit: 2014-10-05 | Discharge: 2014-10-05 | Disposition: A | Discharge status: Home / Self Care | Payer: Medicare Other | Source: Ambulatory Visit

## 2014-10-05 DIAGNOSIS — Z1231 Encounter for screening mammogram for malignant neoplasm of breast: Secondary | ICD-10-CM

## 2015-02-25 ENCOUNTER — Other Ambulatory Visit: Payer: Self-pay

## 2015-04-25 ENCOUNTER — Encounter: Payer: Self-pay | Admitting: Cardiology

## 2015-04-25 ENCOUNTER — Ambulatory Visit (INDEPENDENT_AMBULATORY_CARE_PROVIDER_SITE_OTHER): Payer: Medicare Other | Admitting: Cardiology

## 2015-04-25 ENCOUNTER — Other Ambulatory Visit: Payer: Self-pay | Admitting: Cardiology

## 2015-04-25 VITALS — BP 118/62 | HR 63 | Ht 63.5 in | Wt 149.0 lb

## 2015-04-25 DIAGNOSIS — I5032 Chronic diastolic (congestive) heart failure: Secondary | ICD-10-CM

## 2015-04-25 DIAGNOSIS — H35379 Puckering of macula, unspecified eye: Secondary | ICD-10-CM | POA: Insufficient documentation

## 2015-04-25 DIAGNOSIS — M1990 Unspecified osteoarthritis, unspecified site: Secondary | ICD-10-CM | POA: Insufficient documentation

## 2015-04-25 DIAGNOSIS — C8590 Non-Hodgkin lymphoma, unspecified, unspecified site: Secondary | ICD-10-CM

## 2015-04-25 DIAGNOSIS — I10 Essential (primary) hypertension: Secondary | ICD-10-CM

## 2015-04-25 DIAGNOSIS — Z9889 Other specified postprocedural states: Secondary | ICD-10-CM | POA: Insufficient documentation

## 2015-04-25 DIAGNOSIS — L718 Other rosacea: Secondary | ICD-10-CM | POA: Insufficient documentation

## 2015-04-25 NOTE — Progress Notes (Signed)
[place]. [address] [place], [place]  [address] Phone: [phone] Fax:  [phone]  Date:  04/25/2015   ID:  Nicole [name], Nicole [name] Aug 21, 1933, MRN [id number]  PCP:  Vidal Schwalbe, MD   History of Present Illness: Nicole [name] is a 81 y.o. female with hypertriglyceridemia, diastolic dysfunction, parotid gland disease. Diastolic dysfunction-on low-dose metoprolol. Prior uphill SOB has increased, moderate in severity, relieved with rest, no associated symptoms of chest discomfort or heaviness. CT of chest showed 2 small areas of concern. Dr. Arlyce Dice Perform surgery,monitored.   She has had hypertriglyceridemia. This is improved significantly with fish oil. Down into the 150-190 range.  Traveling often. [place] trips. She stays in youth hostils. Enjoying time with her friend Shanon Brow.  [place]. Eye. 5 chemo tx. Rituxan. [facility].  Last 10/2014  Noted SOB with inclines (declined since knee pain has limited walking). No CP.     Wt Readings from Last 3 Encounters:  04/25/15 149 lb (67.586 kg)  04/25/14 150 lb (68.04 kg)  07/26/12 141 lb 6.4 oz (64.139 kg)     Past Medical History  Diagnosis Date  . Non Hodgkin's lymphoma   . Abscess of parotid gland 01/2011    w/parotid stones  . Positional vertigo   . Sjogren's syndrome   . IBS (irritable bowel syndrome)   . Reflux   . Depression   . Heartburn   . Personal history of colonic polyps 04/13/2007    hyperplastic  . 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 Salivary gland surgery  01/2011 & 04/2011  . Lung removal, partial  04/1999    right lung  . Appendectomy    . Tonsillectomy and adenoidectomy    . Vein surgery  1966  . Cataract extraction  2008 &2010    bilateral  . Iredectomy  2008    left  . Oophorectomy  2000    Current Outpatient Prescriptions  Medication Sig Dispense Refill  . aspirin 81 MG tablet Take 81 mg by mouth daily.       . Calcium Carbonate-Vitamin D (CALCIUM-VITAMIN D) 600-200 MG-UNIT CAPS Take 1 tablet by mouth daily.     . Cholecalciferol (VITAMIN D3) 1000 UNITS CAPS Take 1,000 Units by mouth daily.      . cycloSPORINE (RESTASIS) 0.05 % ophthalmic emulsion Place 1 drop into both eyes 2 (two) times daily.    Marland Kitchen estrogens, conjugated, (PREMARIN) 0.625 MG tablet Take 0.625 mg by mouth daily.     . Flaxseed, Linseed, (FLAX SEED OIL) 1000 MG CAPS Take 1,000 mg by mouth daily.      Marland Kitchen glucosamine-chondroitin (GLUCOSAMINE-CHONDROITIN DS) 500-400 MG tablet 1 tablet 2 (two) times daily.     . Methylsulfonylmethane (MSM) 1000 MG CAPS Take 1,000 mg by mouth daily.      . metoprolol succinate (TOPROL-XL) 25 MG 24 hr tablet Take 1 tablet (25 mg total) by mouth daily. 30 tablet 5  . Multiple Vitamin (MULTIVITAMIN) capsule Take 1 capsule by mouth daily.      . OMEGA 3 1000 MG CAPS Take 1,000 mg by mouth 2 (two) times daily.      [name] Glycol-Propyl Glycol (SYSTANE OP) Apply 1 drop to eye as needed. Both eyes    . vitamin C (ASCORBIC ACID) 500 MG tablet Take 500 mg by mouth daily.       No current  facility-administered medications for this visit.    Allergies:    Allergies  Allergen Reactions  . Ace Inhibitors     cough  . Protonix [Pantoprazole Sodium]     Dizziness    Social History:  The patient  reports that she has quit smoking. She has never used smokeless tobacco. She reports that she does not drink alcohol or use illicit drugs.   Family History  Problem Relation Age of Onset  . Celiac disease Mother   . Heart disease Mother   . Heart disease Father   . Heart failure Father   . Kidney cancer Daughter     ROS:  Please see the history of present illness.  Fingernail splitting. Hair loss-possible chemotherapy side effect Positive for shortness of breath, chronic. No fevers, chills, orthopnea, PND   All other systems reviewed and negative.   PHYSICAL EXAM: VS:  BP 118/62 mmHg  Pulse 63  Ht 5'  3.5" (1.613 m)  Wt 149 lb (67.586 kg)  BMI 25.98 kg/m2 Well nourished, well developed, in no acute distress HEENT: normal, [place]/AT, EOMI Neck: no JVD, normal carotid upstroke, no bruit Cardiac:  normal S1, S2; RRR; 2/6 S murmur Lungs:  clear to auscultation bilaterally, no wheezing, rhonchi or rales Abd: soft, nontender, no hepatomegaly, no bruits Ext: no edema, 2+ distal pulses Skin: warm and dry GU: deferred Neuro: no focal abnormalities noted, AAO x 3  EKG:  Today-04/25/15-sinus rhythm, 63, low voltage, poor R-wave progression, personally viewed-04/25/14-sinus rhythm, no other abnormalities, baseline artifact.  ECHO: 07/21/12 - normal ejection fraction, calcified aortic valve (question murmur), trace MR, diastolic dysfunction grade 1  NUC: 07/21/12 - low risk, no ischemia, normal EF.  ASSESSMENT AND PLAN:  1. Chronic diastolic heart failure-symptoms of shortness of breath has been chronic but improved. Fairly well controlled. Echocardiogram reassuring. Multifactorial also from deconditioning. Continue with metoprolol. Continue to monitor. Previous nuclear stress test reassuring, echocardiogram reassuring. CT scan of chest reassuring with no parenchymal lung disease. No pericardial effusion. 2. Hypertriglyceridemia-previous excellent response to fish oil. 3. Hypertension-well-controlled. Medications reviewed. 4. Lymphoma - ended therapy in 10/2014 - [facility]. Doing well.  5. Knee pain - left, instability. Seeing Dr. Latanya Maudlin. MRI 6. One-year followup.  Signed, Candee Furbish, MD [facility]  04/25/2015 9:47 AM

## 2015-04-25 NOTE — Patient Instructions (Signed)
Medication Instructions:  Your physician recommends that you continue on your current medications as directed. Please refer to the Current Medication list given to you today.   Labwork: None today  Testing/Procedures: None today  Follow-Up: Your physician wants you to follow-up in: 1 year with Dr.Skains You will receive a reminder letter in the mail two months in advance. If you don't receive a letter, please call our office to schedule the follow-up appointment.   Any Other Special Instructions Will Be Listed Below (If Applicable).

## 2015-09-24 ENCOUNTER — Other Ambulatory Visit: Payer: Self-pay

## 2015-09-24 DIAGNOSIS — Z1231 Encounter for screening mammogram for malignant neoplasm of breast: Secondary | ICD-10-CM

## 2015-10-11 ENCOUNTER — Ambulatory Visit
Admission: RE | Admit: 2015-10-11 | Discharge: 2015-10-11 | Disposition: A | Discharge status: Home / Self Care | Payer: Medicare Other | Source: Ambulatory Visit

## 2015-10-11 DIAGNOSIS — Z1231 Encounter for screening mammogram for malignant neoplasm of breast: Secondary | ICD-10-CM

## 2015-11-29 DIAGNOSIS — J310 Chronic rhinitis: Secondary | ICD-10-CM | POA: Insufficient documentation

## 2015-11-29 DIAGNOSIS — K115 Sialolithiasis: Secondary | ICD-10-CM | POA: Insufficient documentation

## 2015-11-29 DIAGNOSIS — K1123 Chronic sialoadenitis: Secondary | ICD-10-CM | POA: Insufficient documentation

## 2016-04-21 ENCOUNTER — Other Ambulatory Visit: Payer: Self-pay | Admitting: *Deleted

## 2016-04-21 ENCOUNTER — Other Ambulatory Visit: Payer: Self-pay | Admitting: Cardiology

## 2016-04-21 MED ORDER — METOPROLOL SUCCINATE ER 25 MG PO TB24: 25.0000 mg | ORAL_TABLET | Freq: Every day | ORAL | 1 refills | Status: DC

## 2016-06-16 ENCOUNTER — Other Ambulatory Visit: Payer: Self-pay | Admitting: Cardiology

## 2016-07-20 ENCOUNTER — Encounter: Payer: Self-pay | Admitting: *Deleted

## 2016-07-29 ENCOUNTER — Encounter: Payer: Self-pay | Admitting: Cardiology

## 2016-07-29 ENCOUNTER — Ambulatory Visit (INDEPENDENT_AMBULATORY_CARE_PROVIDER_SITE_OTHER): Payer: Medicare Other | Admitting: Cardiology

## 2016-07-29 VITALS — BP 128/60 | HR 67 | Ht 63.5 in | Wt 151.2 lb

## 2016-07-29 DIAGNOSIS — E781 Pure hyperglyceridemia: Secondary | ICD-10-CM | POA: Diagnosis not present

## 2016-07-29 DIAGNOSIS — I10 Essential (primary) hypertension: Secondary | ICD-10-CM | POA: Diagnosis not present

## 2016-07-29 DIAGNOSIS — I5032 Chronic diastolic (congestive) heart failure: Secondary | ICD-10-CM | POA: Diagnosis not present

## 2016-07-29 MED ORDER — HYDROCHLOROTHIAZIDE 12.5 MG PO CAPS: 12.5000 mg | ORAL_CAPSULE | Freq: Every day | ORAL | 11 refills | Status: DC

## 2016-07-29 NOTE — Patient Instructions (Signed)
Medication Instructions:  1) START HCTZ (HYDROCHLOROTHIAZIDE) 12.5 mg daily  Labwork: None  Testing/Procedures: None  Follow-Up: Your physician wants you to follow-up in: 1 year with Dr. Marlou Porch. You will receive a reminder letter in the mail two months in advance. If you don't receive a letter, please call our office to schedule the follow-up appointment.   Any Other Special Instructions Will Be Listed Below (If Applicable).     If you need a refill on your cardiac medications before your next appointment, please call your pharmacy.

## 2016-07-29 NOTE — Progress Notes (Signed)
[place]. [address], [address] [place], [place]  [address] Phone: [phone] Fax:  [phone]  Date:  07/29/2016   ID:  [name], Nicole 02/19/1933, MRN [id number]  PCP:  Vidal Schwalbe, MD   History of Present Illness: Nicole [name] is a 82 y.o. female with hypertriglyceridemia, diastolic dysfunction, parotid gland disease. Diastolic dysfunction-on low-dose metoprolol. Prior uphill SOB has increased, moderate in severity, relieved with rest, no associated symptoms of chest discomfort or heaviness. CT of chest showed 2 small areas of concern. Dr. Arlyce Dice Perform surgery,monitored.   She has had hypertriglyceridemia. This is improved significantly with fish oil. Down into the 150-190 range.  Traveling often. [place] trips. She stays in youth hostils. Enjoying time with her friend Nicole [name].  [place] travel.   Lymphoma. Eye. 5 chemo tx. Rituxan. [facility].  Last 10/2014  Noted SOB with inclines (declined since knee pain has limited walking). No CP. No real change in symptoms. Parotid gland issues have resolved.  Her blood pressure was slightly elevated at home. She showed me home values. We decided to add back on HCTZ 12.5 mg.    Wt Readings from Last 3 Encounters:  07/29/16 151 lb 3.2 oz (68.6 kg)  04/25/15 149 lb (67.6 kg)  04/25/14 150 lb (68 kg)     Past Medical History:  Diagnosis Date  . Abscess of parotid gland 01/2011   w/parotid stones  . Cancer ([place]) 1992   NHL--BALT  . Depression   . Heartburn   . IBS (irritable bowel syndrome)   . Non Hodgkin's lymphoma ([place])   . Personal history of colonic polyps 04/13/2007   hyperplastic  . Positional vertigo   . Reflux   . Sjogren's syndrome [facility])     Past Surgical History:  Procedure Laterality Date  . APPENDECTOMY    . CATARACT EXTRACTION  2008 &2010   bilateral  . iredectomy  2008   left  . LUNG REMOVAL, PARTIAL  04/1999   right lung  . OOPHORECTOMY  2000  . SALIVARY GLAND  SURGERY  01/2011 & 04/2011  . TONSILLECTOMY AND ADENOIDECTOMY    . VEIN SURGERY  1966    Current Outpatient Prescriptions  Medication Sig Dispense Refill  . aspirin 81 MG tablet Take 81 mg by mouth daily.      . Biotin 1000 MCG tablet Take 1,000 mcg by mouth daily.    . Calcium Carbonate-Vitamin D (CALCIUM-VITAMIN D) 600-200 MG-UNIT CAPS Take 1 tablet by mouth daily.     . Cholecalciferol (VITAMIN D3) 1000 UNITS CAPS Take 1,000 Units by mouth daily.      . Flaxseed, Linseed, (FLAX SEED OIL) 1000 MG CAPS Take 1,000 mg by mouth daily.      . Glucosamine 750 MG TABS Take 750 mg by mouth daily.    . Methylsulfonylmethane (MSM) 1000 MG CAPS Take 1,000 mg by mouth daily.      . metoprolol succinate (TOPROL-XL) 25 MG 24 hr tablet Take 1 tablet (25 mg total) by mouth daily. Please keep upcoming 07/2016 appointment for further refills 30 tablet 1  . Multiple Vitamin (MULTIVITAMIN) capsule Take 1 capsule by mouth daily.      . OMEGA 3 1000 MG CAPS Take 1,000 mg by mouth 2 (two) times daily.      [name] Glycol-Propyl Glycol (SYSTANE OP) Apply 1 drop to eye as needed. Both eyes    . PREMARIN 0.45 MG tablet Take 1 tablet by  mouth daily.    . vitamin C (ASCORBIC ACID) 500 MG tablet Take 500 mg by mouth daily.      . hydrochlorothiazide (MICROZIDE) 12.5 MG capsule Take 1 capsule (12.5 mg total) by mouth daily. 30 capsule 11   No current facility-administered medications for this visit.     Allergies:    Allergies  Allergen Reactions  . Ace Inhibitors     cough  . Protonix [Pantoprazole Sodium]     Dizziness    Social History:  The patient  reports that she has quit smoking. She has never used smokeless tobacco. She reports that she does not drink alcohol or use drugs.   Family History  Problem Relation Age of Onset  . Celiac disease Mother   . Heart disease Mother   . Heart disease Father   . Heart failure Father   . Kidney cancer Daughter     ROS:  Please see the history of present  illness.  Fingernail splitting. Hair loss-possible chemotherapy side effect Positive for shortness of breath, chronic. No fevers, chills, orthopnea, PND   All other systems reviewed and negative.   PHYSICAL EXAM: VS:  BP 128/60   Pulse 67   Ht 5' 3.5" (1.613 m)   Wt 151 lb 3.2 oz (68.6 kg)   LMP  (LMP Unknown)   BMI 26.36 kg/m  Well nourished, well developed, in no acute distress  HEENT: normal, [place]/AT, EOMI Neck: no JVD, normal carotid upstroke, no bruit Cardiac:  normal S1, S2; RRR; 2/6 S murmur  Lungs:  clear to auscultation bilaterally, no wheezing, rhonchi or rales  Abd: soft, nontender, no hepatomegaly, no bruits  Ext: no edema, 2+ distal pulses Skin: warm and dry  GU: deferred Neuro: no focal abnormalities noted, AAO x 3  EKG:  Today-07/29/16-sinus rhythm, 67, no other abnormalities. Personally viewed. 04/25/15-sinus rhythm, 63, low voltage, poor R-wave progression, personally viewed-04/25/14-sinus rhythm, no other abnormalities, baseline artifact.  ECHO: 07/21/12 - normal ejection fraction, calcified aortic valve (question murmur), trace MR, diastolic dysfunction grade 1  NUC: 07/21/12 - low risk, no ischemia, normal EF.  ASSESSMENT AND PLAN:  1. Chronic diastolic heart failure-symptoms of shortness of breath has been chronic but improved. Fairly well controlled. Echocardiogram reassuring. Multifactorial also from deconditioning. Continue with metoprolol. Continue to monitor. Previous nuclear stress test reassuring, echocardiogram reassuring. CT scan of chest reassuring with no parenchymal lung disease. No pericardial effusion. 2. Hypertriglyceridemia-previous excellent response to fish oil. 3. Hypertension-restarting HCTZ 12.5 mg. Continue with current metoprolol dose. Pulse is been in the 50s to 60s. Medications reviewed. 4. Lymphoma - ended therapy in 10/2014 - [facility]. Doing well. Parotid Dr. Candis Schatz.  5. Knee pain - Seeing Dr. Latanya Maudlin.  6. One-year followup.  Signed, Candee Furbish, MD [facility]  07/29/2016 10:18 AM

## 2016-08-25 ENCOUNTER — Other Ambulatory Visit: Payer: Self-pay | Admitting: Cardiology

## 2016-08-25 ENCOUNTER — Other Ambulatory Visit: Payer: Self-pay | Admitting: *Deleted

## 2016-08-25 MED ORDER — METOPROLOL SUCCINATE ER 25 MG PO TB24: 25.0000 mg | ORAL_TABLET | Freq: Every day | ORAL | 3 refills | Status: DC

## 2016-09-09 ENCOUNTER — Other Ambulatory Visit: Payer: Self-pay | Admitting: Family Medicine

## 2016-09-09 DIAGNOSIS — Z1231 Encounter for screening mammogram for malignant neoplasm of breast: Secondary | ICD-10-CM

## 2016-10-13 ENCOUNTER — Ambulatory Visit: Payer: Medicare Other

## 2016-10-13 ENCOUNTER — Ambulatory Visit
Admission: RE | Admit: 2016-10-13 | Discharge: 2016-10-13 | Disposition: A | Discharge status: Home / Self Care | Payer: Medicare Other | Source: Ambulatory Visit | Attending: Family Medicine | Admitting: Family Medicine

## 2016-10-13 DIAGNOSIS — Z1231 Encounter for screening mammogram for malignant neoplasm of breast: Secondary | ICD-10-CM

## 2017-06-25 ENCOUNTER — Ambulatory Visit: Payer: Medicare Other | Attending: Family Medicine | Admitting: Physical Therapy

## 2017-06-25 ENCOUNTER — Encounter: Payer: Self-pay | Admitting: Physical Therapy

## 2017-06-25 VITALS — BP 141/79 | HR 62

## 2017-06-25 DIAGNOSIS — R2681 Unsteadiness on feet: Secondary | ICD-10-CM | POA: Diagnosis present

## 2017-06-25 DIAGNOSIS — H8111 Benign paroxysmal vertigo, right ear: Secondary | ICD-10-CM | POA: Diagnosis not present

## 2017-06-25 DIAGNOSIS — R42 Dizziness and giddiness: Secondary | ICD-10-CM

## 2017-06-25 DIAGNOSIS — R262 Difficulty in walking, not elsewhere classified: Secondary | ICD-10-CM

## 2017-06-26 NOTE — Therapy (Signed)
[place] [address] [place], [place], [address] Phone: [phone]   Fax:  [phone]  Physical Therapy Evaluation  Patient Details  Name: Nicole [name] MRN: [id number] Date of Birth: 04-08-1933 Referring Provider: Harlan Stains, MD  Encounter Date: 06/25/2017      PT End of Session - 06/26/17 2126    Visit Number 1   Number of Visits 17   Date for PT Re-Evaluation 08/24/17  reassess LTG at 4 weeks to determine need for final visits   Authorization Type [facility] Medicare: $40 copay; G code and PN every 10th visit   PT Start Time 0933   PT Stop Time 1019   PT Time Calculation (min) 46 min   Activity Tolerance Patient tolerated treatment well   Behavior During Therapy [facility] for tasks assessed/performed      Past Medical History:  Diagnosis Date  . Abscess of parotid gland 01/2011   w/parotid stones  . Cancer ([place]) 1992   NHL--BALT  . Depression   . Heartburn   . IBS (irritable bowel syndrome)   . Non Hodgkin's lymphoma ([place])   . Personal history of colonic polyps 04/13/2007   hyperplastic  . Positional vertigo   . Reflux   . Sjogren's syndrome [facility])     Past Surgical History:  Procedure Laterality Date  . APPENDECTOMY    . BREAST BIOPSY Right 1986   Benign   . CATARACT EXTRACTION  2008 &2010   bilateral  . iredectomy  2008   left  . LUNG REMOVAL, PARTIAL  04/1999   right lung  . OOPHORECTOMY  2000  . SALIVARY GLAND SURGERY  01/2011 & 04/2011  . TONSILLECTOMY AND ADENOIDECTOMY    . VEIN SURGERY  1966    Vitals:   06/25/17 0941  BP: (!) 141/79  Pulse: 62         Subjective Assessment - 06/26/17 2134    Subjective Pt presents to OPPT for evaluation of vertigo.  Pt does have a history of recurrent BPPV since the 80's.  This acute episode began on September 20th, when she got up out of the bed.  Became very dizzy and had to sit back down.  Reports having some nausea but no vomiting.  No  headache, changes in vision or hearing, has history of tinnitus, no falls.   Pt also has history of osteopenia and is due for a bone density scan soon and a history of chemotherapy.   Pertinent History CHF, IBS, Non-Hodgkin lymphoma s/p R upper and middle lobe pneumonectomy in 2000, L knee pain, tinnitus, h/o BPPV, osteopenia and HTN   Patient Stated Goals To decrease the severity of dizziness. "I've lived with this for a long time".   Currently in Pain? No/denies            [facility] PT Assessment - 06/26/17 2134      Assessment   Medical Diagnosis BPPV   Referring Provider Harlan Stains, MD   Onset Date/Surgical Date 05/20/17   Prior Therapy 5-6 years ago for dizziness     Precautions   Precautions Other (comment)   Precaution Comments CHF, IBS, Non-Hodgkin lymphoma s/p R upper and middle lobe pneumonectomy in 2000, L knee pain, tinnitus, h/o BPPV, osteopenia and HTN     [place] residence   Living Arrangements Spouse/significant other  female friend stays on the weekend   Type of [place] Access Other (comment)  6 steps down to parking lot   [place] One level     Prior Function   Level of Independence Independent     Observation/Other Assessments   Focus on Therapeutic Outcomes (FOTO)  73 (27% limited; predicted 20% limitation by D/C)   Other Surveys  Other Surveys   Dizziness Handicap Inventory [facility])  30 Mild            Vestibular Assessment - 06/26/17 2135      Vestibular Assessment   General Observation no signs of imbalance during gait but reports dizziness once sitting down; back in the 60's pt had infection in ear that caused tinnitus.  Has had multiple rounds of chemotherapy (neuropathy?)     Symptom Behavior   Type of Dizziness "Funny feeling in head"   Frequency of Dizziness intermittent episodes   Duration of Dizziness varies-seconds to minutes   Aggravating Factors Spontaneous onset;Turning body  quickly;Turning head quickly   Relieving Factors Slow movements;Rest     Occulomotor Exam   Occulomotor Alignment Normal   Spontaneous Absent   Gaze-induced Absent   Smooth Pursuits Intact   Saccades Intact   Comment Convergence intact     Vestibulo-Occular Reflex   VOR to Slow Head Movement Normal   Comment HIT: + bilaterally     Positional Testing   Dix-Hallpike Dix-Hallpike Right;Dix-Hallpike Left   Horizontal Canal Testing Horizontal Canal Right;Horizontal Canal Left     Dix-Hallpike Right   Dix-Hallpike Right Duration > 1 minute   Dix-Hallpike Right Symptoms Downbeat Nystagmus     Dix-Hallpike Left   Dix-Hallpike Left Duration > 1 min   Dix-Hallpike Left Symptoms No nystagmus  but significant vertigo     Horizontal Canal Right   Horizontal Canal Right Duration 0        Objective measurements completed on examination: See above findings.                  PT Education - 06/26/17 2124    Education provided Yes   Education Details clinical findings, PT POC, goals   Person(s) Educated Patient   Methods Explanation   Comprehension Verbalized understanding          PT Short Term Goals - 06/26/17 2139      PT SHORT TERM GOAL #1   Title Pt will participate in further assessment of hypofunction and balance with DVA, gait velocity and FGA   Time 2   Period Weeks   Status New   Target Date 07/09/17     PT SHORT TERM GOAL #2   Title Pt will demonstrate independence with initial gaze adaptation, habituation and balance HEP   Time 2   Period Weeks   Status New   Target Date 07/09/17     PT SHORT TERM GOAL #3   Title Pt will tolerate treatment of anterior canal cupulolithiasis and will report vertigo with provocative movements as <4/10   Time 2   Period Weeks   Status New   Target Date 07/09/17           PT Long Term Goals - 06/26/17 2142      PT LONG TERM GOAL #1   Title (60 day certification; re-assess LTG at week 4 to determine need  for rest of visits)  Pt will demonstrate independence with ongoing HEP   Time 4   Period Weeks   Status New   Target Date 07/25/17     PT LONG TERM GOAL #2  Title Pt will report 10 point decrease in [facility]   Baseline 30   Time 4   Period Weeks   Status New   Target Date 07/25/17     PT LONG TERM GOAL #3   Title Pt will demonstrate 2/3 line difference on DVA    Baseline TBD   Time 4   Period Weeks   Status New   Target Date 07/25/17     PT LONG TERM GOAL #4   Title Pt will demonstrate negative positional testing   Baseline R? anterior canal cupulolithiasis   Time 4   Period Weeks   Status New   Target Date 07/25/17     PT LONG TERM GOAL #5   Title Will increase FGA to > or = 22/30 to decrease falls risk   Baseline TBD   Time 4   Period Weeks   Status New   Target Date 07/25/17     Additional Long Term Goals   Additional Long Term Goals Yes     PT LONG TERM GOAL #6   Title Will improve gait velocity to > or = 3.0 ft/sec to decrease falls risk during community ambulation   Baseline TBD   Time 4   Period Weeks   Status New   Target Date 07/25/17                Plan - 2017-07-24 Dec 25, 2126    Clinical Impression Statement Pt is an 83 year old female presenting to [place] neuro for evaluation of positional vertigo.  Pt's PMH is significant for the following: CHF, IBS, Non-Hodgkin lymphoma s/p R upper and middle lobe pneumonectomy in 12-25-1998, L knee pain, tinnitus, h/o BPPV, osteopenia and HTN. The following deficits were noted during pt's exam: impaired VOR and bilat vestibular hypofunction, impaired balance and gait and + for true vertigo and downbeat nystagmus lasting >1 minute with R Hallpike-Dix indicating anterior canal cupulolithiasis, laterality unspecified.  Pt would benefit from skilled PT to address these impairments and functional limitations to maximize functional mobility independence and reduce falls risk.   History and Personal Factors relevant to plan of care:  CHF, IBS, Non-Hodgkin lymphoma s/p R upper and middle lobe pneumonectomy in December 25, 1998, L knee pain, tinnitus, h/o BPPV, osteopenia and HTN, h/o chemotherapy, lives alone   Clinical Presentation Evolving   Clinical Presentation due to: BPPV and vestibular hypofunction, CHF, IBS, Non-Hodgkin lymphoma s/p R upper and middle lobe pneumonectomy in 25-Dec-1998, L knee pain, tinnitus, h/o dizziness and BPPV, osteopenia and HTN, h/o chemotherapy, lives alone    Clinical Decision Making Moderate   Rehab Potential Good   PT Frequency 2x / week   PT Duration 8 weeks  will reassess goals at 4 weeks to determine need for more visits   PT Treatment/Interventions ADLs/Self Care Home Management;Canalith Repostioning;Gait training;Stair training;Functional mobility training;Therapeutic activities;Therapeutic exercise;Balance training;Neuromuscular re-education;Patient/family education;Vestibular   PT Next Visit Plan re-assess canals with frenzel lenses; treat anterior cupulolithiasis as indicated.  Assess DVA, gait velocity and FGA-set goal baseline, educate on x 1 viewing   Consulted and Agree with Plan of Care Patient      Patient will benefit from skilled therapeutic intervention in order to improve the following deficits and impairments:  Decreased balance, Dizziness, Difficulty walking  Visit Diagnosis: BPPV (benign paroxysmal positional vertigo), right  Dizziness and giddiness  Unsteadiness on feet  Difficulty in walking, not elsewhere classified      G-Codes - 07-24-17 2145-12-24    Functional Assessment Tool Used (  Outpatient Only) positional testing + for anterior canal cupulolithiasis; DHI: 30   Functional Limitation Mobility: Walking and moving around   Mobility: Walking and Moving Around Current Status [phone]) At least 20 percent but less than 40 percent impaired, limited or restricted   Mobility: Walking and Moving Around Goal Status [phone]) At least 1 percent but less than 20 percent impaired, limited or  restricted       Problem List Patient Active Problem List   Diagnosis Date Noted  . Chronic parotitis 11/29/2015  . Parotid sialolithiasis 11/29/2015  . Rhinitis, chronic 11/29/2015  . Arthritis 04/25/2015  . 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 History of biliary T-tube placement 04/25/2015  . Non Hodgkin's lymphoma ([place]) 04/25/2015  . Ocular rosacea 04/25/2015  . Chronic diastolic heart failure ([place]) 04/25/2014  . Essential hypertension 04/25/2014  . Hypertriglyceridemia 04/25/2014  . Positional vertigo   . Sjogren's disease ([place])   . IBS (irritable bowel syndrome)   . Reflux   . Depression   . Abnormal CAT scan 01/17/2013  . Chronic cough 01/17/2013  . 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 Marginal zone lymphoma 07/17/2011  . Abscess of parotid gland 01/30/2011  . Personal history of colonic polyps 04/13/2007    Rico Junker, PT, DPT 06/26/17    9:49 PM    [facility] [address] [place], [place], [address] Phone: [phone]   Fax:  [phone]  Name: Nicole [name] MRN: [id number] Date of Birth: 1933-07-12

## 2017-06-30 ENCOUNTER — Ambulatory Visit: Payer: Medicare Other | Admitting: Physical Therapy

## 2017-06-30 ENCOUNTER — Encounter: Payer: Self-pay | Admitting: Physical Therapy

## 2017-06-30 DIAGNOSIS — R42 Dizziness and giddiness: Secondary | ICD-10-CM

## 2017-06-30 DIAGNOSIS — R262 Difficulty in walking, not elsewhere classified: Secondary | ICD-10-CM

## 2017-06-30 DIAGNOSIS — R2681 Unsteadiness on feet: Secondary | ICD-10-CM

## 2017-06-30 DIAGNOSIS — H8111 Benign paroxysmal vertigo, right ear: Secondary | ICD-10-CM | POA: Diagnosis not present

## 2017-06-30 NOTE — Patient Instructions (Signed)
Gaze Stabilization: Sitting    Keeping eyes on target on wall 3 feet away, and move head side to side for _30__ seconds. Repeat while moving head up and down for __30_ seconds. Do __2-3__ sessions per day.  Copyright  VHI. All rights reserved.   Gaze Stabilization: Tip Card  1.Target must remain in focus, not blurry, and appear stationary while head is in motion. 2.Perform exercises with small head movements (45 to either side of midline). 3.Increase speed of head motion so long as target is in focus. 4.If you wear eyeglasses, be sure you can see target through lens (therapist will give specific instructions for bifocal / progressive lenses). 5.These exercises may provoke dizziness or nausea. Work through these symptoms. If too dizzy, slow head movement slightly. Rest between each exercise. 6.Exercises demand concentration; avoid distractions.  Copyright  VHI. All rights reserved.     

## 2017-06-30 NOTE — Therapy (Signed)
[facility] [address] [place], [place], [address] Phone: [phone]   Fax:  [phone]  Physical Therapy Treatment  Patient Details  Name: Nicole [name] MRN: [id number] Date of Birth: 04-22-1933 Referring Provider: Harlan Stains, MD  Encounter Date: 06/30/2017      PT End of Session - 06/30/17 2145    Visit Number 2   Number of Visits 17   Date for PT Re-Evaluation 08/24/17  reassess LTG at 4 weeks to determine need for final visits   Authorization Type [facility] Medicare: $40 copay; G code and PN every 10th visit   PT Start Time 1452   PT Stop Time 1535   PT Time Calculation (min) 43 min   Activity Tolerance Patient tolerated treatment well   Behavior During Therapy [facility] for tasks assessed/performed      Past Medical History:  Diagnosis Date  . Abscess of parotid gland 01/2011   w/parotid stones  . 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 Depression   . Heartburn   . IBS (irritable bowel syndrome)   . Non Hodgkin's lymphoma ([place])   . Personal history of colonic polyps 04/13/2007   hyperplastic  . Positional vertigo   . Reflux   . Sjogren's syndrome [facility])     Past Surgical History:  Procedure Laterality Date  . APPENDECTOMY    . BREAST BIOPSY Right 1986   Benign   . CATARACT EXTRACTION  2008 &2010   bilateral  . iredectomy  2008   left  . LUNG REMOVAL, PARTIAL  04/1999   right lung  . OOPHORECTOMY  2000  . SALIVARY GLAND SURGERY  01/2011 & 04/2011  . TONSILLECTOMY AND ADENOIDECTOMY    . VEIN SURGERY  1966    There were no vitals filed for this visit.      Subjective Assessment - 06/30/17 2143    Subjective Dizziness is about the same; unbalance when rising from chair   Pertinent History CHF, IBS, Non-Hodgkin lymphoma s/p R upper and middle lobe pneumonectomy in 2000, L knee pain, tinnitus, h/o BPPV, osteopenia and HTN   Patient Stated Goals To decrease the severity of dizziness. "I've lived with  this for a long time".   Currently in Pain? No/denies                Vestibular Assessment - 06/30/17 1457      Visual Acuity   Static 7   Dynamic 3     Positional Testing   Dix-Hallpike Dix-Hallpike Right;Dix-Hallpike Left     Dix-Hallpike Right   Dix-Hallpike Right Duration >1 minute   Dix-Hallpike Right Symptoms Other (comment);No nystagmus  frenzels-lightheadedness and nausea     Dix-Hallpike Left   Dix-Hallpike Left Duration >1 minute   Dix-Hallpike Left Symptoms No nystagmus;Other (comment)  frenzels-lightheadedness and nausea     Positional Sensitivities   Right Hallpike Lightheadedness   Up from Right Hallpike Lightheadedness   Up from Left Hallpike Lightheadedness   Nose to Right Knee Lightheadedness  nausea   Right Knee to Sitting Lightedness   Nose to Left Knee No dizziness   Left Knee to Sitting No dizziness   Head Turning x 5 Mild dizziness   Head Nodding x 5 Mild dizziness   Pivot Right in Standing Mild dizziness   Pivot Left in Standing Mild dizziness                  Vestibular Treatment/Exercise - 06/30/17 1523  Vestibular Treatment/Exercise   Gaze Exercises X1 Viewing Horizontal;X1 Viewing Vertical               PT Education - 06/30/17 2143    Education provided Yes   Education Details anatomy of vestibular system, motion sensitivity, habituation and gaze stability    Person(s) Educated Patient   Methods Explanation   Comprehension Verbalized understanding          PT Short Term Goals - 06/30/17 2151      PT SHORT TERM GOAL #1   Title Pt will participate in further assessment of hypofunction and balance with DVA, gait velocity and FGA   Baseline need to assess gait velocity and FGA   Time 2   Period Weeks   Status Partially Met     PT SHORT TERM GOAL #2   Title Pt will demonstrate independence with initial gaze adaptation, habituation and balance HEP   Time 2   Period Weeks   Status On-going    Target Date 07/09/17     PT SHORT TERM GOAL #3   Title Pt will tolerate treatment of anterior canal cupulolithiasis and will report vertigo with provocative movements as <4/10   Baseline no evidence of BPPV upon reassessment with frenzel lenses   Time 2   Period Weeks   Status Deferred           PT Long Term Goals - 06/30/17 2152      PT LONG TERM GOAL #1   Title (60 day certification; re-assess LTG at week 4 to determine need for rest of visits)  Pt will demonstrate independence with ongoing HEP   Time 4   Period Weeks   Status On-going   Target Date 07/25/17     PT LONG TERM GOAL #2   Title Pt will report 10 point decrease in [place]   Baseline 30   Time 4   Period Weeks   Status On-going     PT LONG TERM GOAL #3   Title Pt will demonstrate 2/3 line difference on DVA    Baseline 4 line difference   Time 4   Period Weeks   Status On-going     PT LONG TERM GOAL #4   Title Pt will demonstrate negative positional testing   Baseline re assessed on 10/31   Time 4   Period Weeks   Status Achieved     PT LONG TERM GOAL #5   Title Will increase FGA to > or = 22/30 to decrease falls risk   Baseline TBD   Time 4   Period Weeks   Status New     Additional Long Term Goals   Additional Long Term Goals Yes     PT LONG TERM GOAL #6   Title Will improve gait velocity to > or = 3.0 ft/sec to decrease falls risk during community ambulation   Baseline TBD   Time 4   Period Weeks   Status New     PT LONG TERM GOAL #7   Title Pt will report 0-1 intensity on MSQ for bending, head turns, nods and pivot in standing due to improved motion tolerance   Baseline 1-2 intensity   Time 4   Period Weeks   Status New   Target Date 07/25/17               Plan - 06/30/17 2145    Clinical Impression Statement Continued assessment of peripheral canals with use of Frenzel lenses.  With fixation denied no nystagmus observed in testing positions; pt reporting immediate  "lightheadedness" with hyperventilation when lying back into R and L Hallpike that worsens as pt remains in the position.  Continued to assess motion sensitivity with MSQ and vestibular hypofunction with DVA: 4 line difference indicating impaired use of VOR.  Educated pt on motion sensitivities, use of habituation and use of gaze adaptation to train VOR.  Pt does not show any symptoms consistent with peripheral canal BPPV.  Will continue to address hypofunction and motion sensitivity and fear response with graded exposure.     Rehab Potential Good   PT Frequency 2x / week   PT Duration 8 weeks  will reassess goals at 4 weeks to determine need for more visits   PT Treatment/Interventions ADLs/Self Care Home Management;Canalith Repostioning;Gait training;Stair training;Functional mobility training;Therapeutic activities;Therapeutic exercise;Balance training;Neuromuscular re-education;Patient/family education;Vestibular   PT Next Visit Plan assess gait velocity and FGA-set goal baseline, review and progress x 1 viewing   Consulted and Agree with Plan of Care Patient      Patient will benefit from skilled therapeutic intervention in order to improve the following deficits and impairments:  Decreased balance, Dizziness, Difficulty walking  Visit Diagnosis: Unsteadiness on feet  Dizziness and giddiness  Difficulty in walking, not elsewhere classified     Problem List Patient Active Problem List   Diagnosis Date Noted  . Chronic parotitis 11/29/2015  . Parotid sialolithiasis 11/29/2015  . Rhinitis, chronic 11/29/2015  . Arthritis 04/25/2015  . 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 History of biliary T-tube placement 04/25/2015  . Non Hodgkin's lymphoma ([place]) 04/25/2015  . Ocular rosacea 04/25/2015  . Chronic diastolic heart failure ([place]) 04/25/2014  . Essential hypertension 04/25/2014  . Hypertriglyceridemia 04/25/2014  . Positional vertigo   . Sjogren's disease ([place])   . IBS  (irritable bowel syndrome)   . Reflux   . Depression   . Abnormal CAT scan 01/17/2013  . Chronic cough 01/17/2013  . 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 Marginal zone lymphoma 07/17/2011  . Abscess of parotid gland 01/30/2011  . Personal history of colonic polyps 04/13/2007    Rico Junker, PT, DPT 06/30/17    9:57 PM    [facility] [address] [place], [place], [address] Phone: [phone]   Fax:  [phone]  Name: Nicole [name] MRN: [id number] Date of Birth: [birth year]

## 2017-07-09 ENCOUNTER — Ambulatory Visit: Payer: Medicare Other | Attending: Family Medicine | Admitting: Physical Therapy

## 2017-07-09 ENCOUNTER — Encounter: Payer: Self-pay | Admitting: Physical Therapy

## 2017-07-09 DIAGNOSIS — R262 Difficulty in walking, not elsewhere classified: Secondary | ICD-10-CM

## 2017-07-09 DIAGNOSIS — R2681 Unsteadiness on feet: Secondary | ICD-10-CM

## 2017-07-09 DIAGNOSIS — H8111 Benign paroxysmal vertigo, right ear: Secondary | ICD-10-CM | POA: Diagnosis present

## 2017-07-09 DIAGNOSIS — R42 Dizziness and giddiness: Secondary | ICD-10-CM | POA: Insufficient documentation

## 2017-07-09 NOTE — Patient Instructions (Signed)
Habituation - Tip Card  1.The goal of habituation training is to assist in decreasing symptoms of vertigo, dizziness, or nausea provoked by specific head and body motions. 2.These exercises may initially increase symptoms; however, be persistent and work through symptoms. With repetition and time, the exercises will assist in reducing or eliminating symptoms. 3.Exercises should be stopped and discussed with the therapist if you experience any of the following: - Sudden change or fluctuation in hearing - New onset of ringing in the ears, or increase in current intensity - Any fluid discharge from the ear - Severe pain in neck or back - Extreme nausea  Habituation - Sit to Side-Lying   Sit on edge of bed. Lie down onto the right side and hold until dizziness stops, plus 20 seconds.  Return to sitting and wait until dizziness stops, plus 20 seconds.  Repeat to the left side. Repeat sequence 5 times per session. Do 2 sessions per day.      

## 2017-07-09 NOTE — Therapy (Signed)
[facility] [address] [place], [place], [address] Phone: [phone]   Fax:  [phone]  Physical Therapy Treatment  Patient Details  Name: Nicole [name] MRN: [id number] Date of Birth: [birth year] Referring Provider: Harlan Stains, MD   Encounter Date: 07/09/2017  PT End of Session - 07/09/17 1317    Visit Number  3    Number of Visits  17    Date for PT Re-Evaluation  08/24/17 reassess LTG at 4 weeks to determine need for final visits    Authorization Type  [facility] Medicare: $40 copay; G code and PN every 10th visit    PT Start Time  1015    PT Stop Time  1105    PT Time Calculation (min)  50 min    Activity Tolerance  Patient tolerated treatment well    Behavior During Therapy  [facility] for tasks assessed/performed       Past Medical History:  Diagnosis Date  . Abscess of parotid gland 01/2011   w/parotid stones  . 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 Depression   . Heartburn   . IBS (irritable bowel syndrome)   . Non Hodgkin's lymphoma ([place])   . Personal history of colonic polyps 04/13/2007   hyperplastic  . Positional vertigo   . Reflux   . Sjogren's syndrome [facility])     Past Surgical History:  Procedure Laterality Date  . APPENDECTOMY    . BREAST BIOPSY Right 1986   Benign   . CATARACT EXTRACTION  2008 &2010   bilateral  . iredectomy  2008   left  . LUNG REMOVAL, PARTIAL  04/1999   right lung  . OOPHORECTOMY  2000  . SALIVARY GLAND SURGERY  01/2011 & 04/2011  . TONSILLECTOMY AND ADENOIDECTOMY    . VEIN SURGERY  1966    There were no vitals filed for this visit.  Subjective Assessment - 07/09/17 1021    Subjective  Pt reports washing a lot of windows and blinds; looking up a lot has caused her to feel very woozy this week.  Has been performing her home exercises and has worked up to 30 seconds.    Pertinent History  CHF, IBS, Non-Hodgkin lymphoma s/p R upper and middle lobe pneumonectomy in 2000, L  knee pain, tinnitus, h/o BPPV, osteopenia and HTN    Patient Stated Goals  To decrease the severity of dizziness. "I've lived with this for a long time".         [facility] PT Assessment - 07/09/17 1042      Standardized Balance Assessment   Standardized Balance Assessment  10 meter walk test    10 Meter Walk  3 ft/sec      Functional Gait  Assessment   Gait assessed   Yes    Gait Level Surface  Walks 20 ft in less than 7 sec but greater than 5.5 sec, uses assistive device, slower speed, mild gait deviations, or deviates 6-10 in outside of the 12 in walkway width.    Change in Gait Speed  Able to change speed, demonstrates mild gait deviations, deviates 6-10 in outside of the 12 in walkway width, or no gait deviations, unable to achieve a major change in velocity, or uses a change in velocity, or uses an assistive device.    Gait with Horizontal Head Turns  Performs head turns smoothly with slight change in gait velocity (eg, minor disruption to smooth gait path), deviates 6-10 in outside 12  in walkway width, or uses an assistive device.    Gait with Vertical Head Turns  Performs task with moderate change in gait velocity, slows down, deviates 10-15 in outside 12 in walkway width but recovers, can continue to walk.    Gait and Pivot Turn  Pivot turns safely within 3 sec and stops quickly with no loss of balance.    Step Over Obstacle  Is able to step over one shoe box (4.5 in total height) without changing gait speed. No evidence of imbalance.    Gait with Narrow Base of Support  Ambulates less than 4 steps heel to toe or cannot perform without assistance.    Gait with Eyes Closed  Walks 20 ft, slow speed, abnormal gait pattern, evidence for imbalance, deviates 10-15 in outside 12 in walkway width. Requires more than 9 sec to ambulate 20 ft.    Ambulating Backwards  Walks 20 ft, uses assistive device, slower speed, mild gait deviations, deviates 6-10 in outside 12 in walkway width.    Steps   Alternating feet, must use rail.    Total Score  17    FGA comment:  17/30 high falls risk         Vestibular Assessment - 07/09/17 1028      Positional Testing   Sidelying Test  Sidelying Right;Sidelying Left      Sidelying Right   Sidelying Right Duration  >1 minute    Sidelying Right Symptoms  Downbeat, right rotatory nystagmus      Sidelying Left   Sidelying Left Duration  0    Sidelying Left Symptoms  No nystagmus      Orthostatics   BP supine (x 5 minutes)  136/69 RUE    HR supine (x 5 minutes)  67    BP sitting  146/70    HR sitting  66    BP standing (after 1 minute)  120/98    HR standing (after 1 minute)  73    BP standing (after 3 minutes)  139/75    HR standing (after 3 minutes)  90    Orthostatics Comment  pt most symptomatic when transitioning from supine > sit               Vestibular Treatment/Exercise - 07/09/17 1052      Vestibular Treatment/Exercise   Vestibular Treatment Provided  Habituation    Habituation Exercises  Legrand Como Daroff   Number of Reps   1    Symptom Description   down to R shoulder - vertigo with downbeat, R rotary nystagmus            PT Education - 07/09/17 1316    Education provided  Yes    Education Details  result of FGA testing, falls risk; habituation with brandt-daroff    Person(s) Educated  Patient    Methods  Explanation;Demonstration;Handout    Comprehension  Verbalized understanding;Returned demonstration       PT Short Term Goals - 07/09/17 1332      PT SHORT TERM GOAL #1   Title  Pt will participate in further assessment of hypofunction and balance with DVA, gait velocity and FGA    Time  2    Period  Weeks    Status  Achieved      PT SHORT TERM GOAL #2   Title  Pt will demonstrate independence with initial gaze adaptation, habituation and balance HEP    Time  2  Period  Weeks    Status  On-going    Target Date  07/09/17      PT SHORT TERM GOAL #3   Title  Pt will  tolerate treatment of anterior canal cupulolithiasis and will report vertigo with provocative movements as <4/10    Baseline  no evidence of BPPV upon reassessment with frenzel lenses    Time  2    Period  Weeks    Status  Revised    Target Date  07/09/17        PT Long Term Goals - 07/09/17 1334      PT LONG TERM GOAL #1   Title  (60 day certification; re-assess LTG at week 4 to determine need for rest of visits)  Pt will demonstrate independence with ongoing HEP    Time  4    Period  Weeks    Status  On-going    Target Date  07/25/17      PT LONG TERM GOAL #2   Title  Pt will report 10 point decrease in [facility]    Baseline  30    Time  4    Period  Weeks    Status  On-going    Target Date  07/25/17      PT LONG TERM GOAL #3   Title  Pt will demonstrate 2/3 line difference on DVA     Baseline  4 line difference    Time  4    Period  Weeks    Status  On-going    Target Date  07/25/17      PT LONG TERM GOAL #4   Title  Pt will demonstrate negative positional testing    Time  4    Period  Weeks    Status  Revised    Target Date  07/25/17      PT LONG TERM GOAL #5   Title  Will increase FGA to > or = 22/30 to decrease falls risk    Baseline  16/30    Time  4    Period  Weeks    Status  Revised    Target Date  07/25/17      PT LONG TERM GOAL #6   Title  Will improve gait velocity to > or = 3.2 ft/sec to decrease falls risk during community ambulation    Baseline  3.0 ft/sec    Time  4    Period  Weeks    Status  Revised    Target Date  07/25/17      PT LONG TERM GOAL #7   Title  Pt will report 0-1 intensity on MSQ for bending, head turns, nods and pivot in standing due to improved motion tolerance    Baseline  1-2 intensity    Time  4    Period  Weeks    Status  Revised    Target Date  07/25/17            Plan - 07/09/17 1318    Clinical Impression Statement  Performed assessment of orthostatic BP due to continued reports of lightheadedness/wooziness  with transitions.  Pt continued to report significant dizziness supine > sit but no significant drop in BP noted; 20 point drop in systolic noted with sit > stand but pt asymptomatic.  Continued assessment of balance and falls risk with FGA and gait velocity and then initiated treatment of motion sensitivity with Brandt-Daroff.  During Brandt-daroff to R side pt reported  significant vertigo and today pt had visible downbeat, R rotary nystagmus >1 minute in duration.  No symptoms to L side.  Advised pt to perform Nestor Lewandowsky throughout the weekend and then next week PT would proceed with treating cupulolithiasis of R anterior canal.  By end of session pt reported increased dizziness but symptoms settled enough to allow pt to ambulate and drive.  Pt advised to call if symptoms worsen with Brandt-Daroff.      Rehab Potential  Good    PT Frequency  2x / week    PT Duration  8 weeks will reassess goals at 4 weeks to determine need for more visits    PT Treatment/Interventions  ADLs/Self Care Home Management;Canalith Repostioning;Gait training;Stair training;Functional mobility training;Therapeutic activities;Therapeutic exercise;Balance training;Neuromuscular re-education;Patient/family education;Vestibular    PT Next Visit Plan  Sidelying test: treat R ant cupulolithiasis    Consulted and Agree with Plan of Care  Patient       Patient will benefit from skilled therapeutic intervention in order to improve the following deficits and impairments:  Decreased balance, Dizziness, Difficulty walking  Visit Diagnosis: Unsteadiness on feet  Dizziness and giddiness  Difficulty in walking, not elsewhere classified  BPPV (benign paroxysmal positional vertigo), right     Problem List Patient Active Problem List   Diagnosis Date Noted  . Chronic parotitis 11/29/2015  . Parotid sialolithiasis 11/29/2015  . Rhinitis, chronic 11/29/2015  . Arthritis 04/25/2015  . 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  History of biliary T-tube placement 04/25/2015  . Non Hodgkin's lymphoma ([place]) 04/25/2015  . Ocular rosacea 04/25/2015  . Chronic diastolic heart failure ([place]) 04/25/2014  . Essential hypertension 04/25/2014  . Hypertriglyceridemia 04/25/2014  . Positional vertigo   . Sjogren's disease ([place])   . IBS (irritable bowel syndrome)   . Reflux   . Depression   . Abnormal CAT scan 01/17/2013  . Chronic cough 01/17/2013  . 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 Marginal zone lymphoma 07/17/2011  . Abscess of parotid gland 01/30/2011  . Personal history of colonic polyps 04/13/2007    Rico Junker, PT, DPT 07/09/17    1:38 PM    [facility] [address] [place], [place], [address] Phone: [phone]   Fax:  [phone]  Name: Nicole [name] MRN: [id number] Date of Birth: 07-May-1933

## 2017-07-15 ENCOUNTER — Ambulatory Visit: Payer: Medicare Other | Admitting: Physical Therapy

## 2017-07-15 ENCOUNTER — Encounter: Payer: Self-pay | Admitting: Physical Therapy

## 2017-07-15 DIAGNOSIS — R262 Difficulty in walking, not elsewhere classified: Secondary | ICD-10-CM

## 2017-07-15 DIAGNOSIS — R2681 Unsteadiness on feet: Secondary | ICD-10-CM

## 2017-07-15 DIAGNOSIS — R42 Dizziness and giddiness: Secondary | ICD-10-CM

## 2017-07-15 DIAGNOSIS — H8111 Benign paroxysmal vertigo, right ear: Secondary | ICD-10-CM

## 2017-07-15 NOTE — Patient Instructions (Addendum)
Feet Heel-Toe "Tandem"    Arms outstretched to touch walls of hallway, walk a straight line bringing one foot directly in front of the other. Repeat for 4-6 laps per session. Do _2___ sessions per day.   Random Direction Head Motion    Perform in hallway with one hand touching wall.  Walking on solid surface, move head and eyes up and down every 3 steps for 4-6 laps; repeat performing head turns to left and right every 3 steps, 4-6 laps Do __2__ sessions per day.  Can add in diagonal head turns.   WALKING DOWN THE HALLWAY WITH EYES CLOSED - ONE HAND TOUCHING THE WALL.  PERFORM 4-6 LAPS.

## 2017-07-15 NOTE — Therapy (Signed)
[facility] [address] [place], [place], [address] Phone: [phone]   Fax:  [phone]  Physical Therapy Treatment  Patient Details  Name: Nicole [name] MRN: [id number] Date of Birth: 1932/11/05 Referring Provider: Harlan Stains, MD   Encounter Date: 07/15/2017  PT End of Session - 07/15/17 1644    Visit Number  4    Number of Visits  17    Date for PT Re-Evaluation  08/24/17 reassess LTG at 4 weeks to determine need for final visits    Authorization Type  [facility] Medicare: $40 copay; G code and PN every 10th visit    PT Start Time  1538    PT Stop Time  1620    PT Time Calculation (min)  42 min    Activity Tolerance  Patient tolerated treatment well    Behavior During Therapy  [facility] for tasks assessed/performed       Past Medical History:  Diagnosis Date  . Abscess of parotid gland 01/2011   w/parotid stones  . 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 Depression   . Heartburn   . IBS (irritable bowel syndrome)   . Non Hodgkin's lymphoma ([place])   . Personal history of colonic polyps 04/13/2007   hyperplastic  . Positional vertigo   . Reflux   . Sjogren's syndrome [facility])     Past Surgical History:  Procedure Laterality Date  . APPENDECTOMY    . BREAST BIOPSY Right 1986   Benign   . CATARACT EXTRACTION  2008 &2010   bilateral  . iredectomy  2008   left  . LUNG REMOVAL, PARTIAL  04/1999   right lung  . OOPHORECTOMY  2000  . SALIVARY GLAND SURGERY  01/2011 & 04/2011  . TONSILLECTOMY AND ADENOIDECTOMY    . VEIN SURGERY  1966    There were no vitals filed for this visit.  Subjective Assessment - 07/15/17 1545    Subjective  Pt has been performing habituation exercises each day with improvement in symptoms overall.    Pertinent History  CHF, IBS, Non-Hodgkin lymphoma s/p R upper and middle lobe pneumonectomy in 2000, L knee pain, tinnitus, h/o BPPV, osteopenia and HTN    Patient Stated Goals  To decrease the  severity of dizziness. "I've lived with this for a long time".             Vestibular Assessment - 07/15/17 1546      Positional Testing   Sidelying Test  Sidelying Right;Sidelying Left      Sidelying Right   Sidelying Right Duration  0    Sidelying Right Symptoms  No nystagmus      Sidelying Left   Sidelying Left Duration  0    Sidelying Left Symptoms  No nystagmus               Vestibular Treatment/Exercise - 07/15/17 1619      Vestibular Treatment/Exercise   Vestibular Treatment Provided  Gaze    Gaze Exercises  X1 Viewing Horizontal;X1 Viewing Vertical      X1 Viewing Horizontal   Foot Position  seated    Reps  2    Comments  30 then 60 seconds      X1 Viewing Vertical   Foot Position  seated     Reps  2    Comments  30 then 60 seconds      Added the following exercises to HEP:   Feet Heel-Toe "Tandem"  Arms outstretched to touch walls of hallway, walk a straight line bringing one foot directly in front of the other. Repeat for 4-6 laps per session. Do _2___ sessions per day.   Random Direction Head Motion    Perform in hallway with one hand touching wall.  Walking on solid surface, move head and eyes up and down every 3 steps for 4-6 laps; repeat performing head turns to left and right every 3 steps, 4-6 laps Do __2__ sessions per day.  Can add in diagonal head turns.   WALKING DOWN THE HALLWAY WITH EYES CLOSED - ONE HAND TOUCHING THE WALL.  PERFORM 4-6 LAPS.     PT Education - 07/15/17 1643    Education provided  Yes    Education Details  updated HEP    Person(s) Educated  Patient    Methods  Explanation;Demonstration;Handout    Comprehension  Verbalized understanding;Returned demonstration       PT Short Term Goals - 07/09/17 1332      PT SHORT TERM GOAL #1   Title  Pt will participate in further assessment of hypofunction and balance with DVA, gait velocity and FGA    Time  2    Period  Weeks    Status  Achieved       PT SHORT TERM GOAL #2   Title  Pt will demonstrate independence with initial gaze adaptation, habituation and balance HEP    Time  2    Period  Weeks    Status  On-going    Target Date  07/09/17      PT SHORT TERM GOAL #3   Title  Pt will tolerate treatment of anterior canal cupulolithiasis and will report vertigo with provocative movements as <4/10    Baseline  no evidence of BPPV upon reassessment with frenzel lenses    Time  2    Period  Weeks    Status  Revised    Target Date  07/09/17        PT Long Term Goals - 07/09/17 1334      PT LONG TERM GOAL #1   Title  (60 day certification; re-assess LTG at week 4 to determine need for rest of visits)  Pt will demonstrate independence with ongoing HEP    Time  4    Period  Weeks    Status  On-going    Target Date  07/25/17      PT LONG TERM GOAL #2   Title  Pt will report 10 point decrease in [facility]    Baseline  30    Time  4    Period  Weeks    Status  On-going    Target Date  07/25/17      PT LONG TERM GOAL #3   Title  Pt will demonstrate 2/3 line difference on DVA     Baseline  4 line difference    Time  4    Period  Weeks    Status  On-going    Target Date  07/25/17      PT LONG TERM GOAL #4   Title  Pt will demonstrate negative positional testing    Time  4    Period  Weeks    Status  Revised    Target Date  07/25/17      PT LONG TERM GOAL #5   Title  Will increase FGA to > or = 22/30 to decrease falls risk    Baseline  16/30  Time  4    Period  Weeks    Status  Revised    Target Date  07/25/17      PT LONG TERM GOAL #6   Title  Will improve gait velocity to > or = 3.2 ft/sec to decrease falls risk during community ambulation    Baseline  3.0 ft/sec    Time  4    Period  Weeks    Status  Revised    Target Date  07/25/17      PT LONG TERM GOAL #7   Title  Pt will report 0-1 intensity on MSQ for bending, head turns, nods and pivot in standing due to improved motion tolerance    Baseline  1-2  intensity    Time  4    Period  Weeks    Status  Revised    Target Date  07/25/17            Plan - 07/15/17 1645    Clinical Impression Statement  Continued assessment of peripheral vestibular canals with pt reporting very mild dizziness/wooziness but no nystagmus observed in testing position.  Due to effectiveness of Brandt-Daroff advised pt to continue.  Updated HEP to include dynamic balance and gait exercises and upgraded x 1 viewing to 60 seconds in sitting with pt reporting mild symptoms after performing 1 minute of each direction.  Will continue to progress and will assess STG tomorrow.    Rehab Potential  Good    PT Frequency  2x / week    PT Duration  8 weeks will reassess goals at 4 weeks to determine need for more visits    PT Treatment/Interventions  ADLs/Self Care Home Management;Canalith Repostioning;Gait training;Stair training;Functional mobility training;Therapeutic activities;Therapeutic exercise;Balance training;Neuromuscular re-education;Patient/family education;Vestibular    PT Next Visit Plan  STG; balance training - corner; FGA challenges on compliant    Consulted and Agree with Plan of Care  Patient       Patient will benefit from skilled therapeutic intervention in order to improve the following deficits and impairments:  Decreased balance, Dizziness, Difficulty walking  Visit Diagnosis: Unsteadiness on feet  Dizziness and giddiness  Difficulty in walking, not elsewhere classified  BPPV (benign paroxysmal positional vertigo), right     Problem List Patient Active Problem List   Diagnosis Date Noted  . Chronic parotitis 11/29/2015  . Parotid sialolithiasis 11/29/2015  . Rhinitis, chronic 11/29/2015  . Arthritis 04/25/2015  . 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 History of biliary T-tube placement 04/25/2015  . Non Hodgkin's lymphoma ([place]) 04/25/2015  . Ocular rosacea 04/25/2015  . Chronic diastolic heart failure ([place]) 04/25/2014  . Essential  hypertension 04/25/2014  . Hypertriglyceridemia 04/25/2014  . Positional vertigo   . Sjogren's disease ([place])   . IBS (irritable bowel syndrome)   . Reflux   . Depression   . Abnormal CAT scan 01/17/2013  . Chronic cough 01/17/2013  . 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 Marginal zone lymphoma 07/17/2011  . Abscess of parotid gland 01/30/2011  . Personal history of colonic polyps 04/13/2007    Rico Junker, PT, DPT 07/15/17    4:48 PM    [facility] [address] [place], [place], [address] Phone: [phone]   Fax:  [phone]  Name: PATRECE TALLIE MRN: [id number] Date of Birth: [birth year]

## 2017-07-16 ENCOUNTER — Ambulatory Visit: Payer: Medicare Other | Admitting: Physical Therapy

## 2017-07-16 ENCOUNTER — Encounter: Payer: Self-pay | Admitting: Physical Therapy

## 2017-07-16 DIAGNOSIS — R2681 Unsteadiness on feet: Secondary | ICD-10-CM | POA: Diagnosis not present

## 2017-07-16 DIAGNOSIS — R262 Difficulty in walking, not elsewhere classified: Secondary | ICD-10-CM

## 2017-07-16 DIAGNOSIS — R42 Dizziness and giddiness: Secondary | ICD-10-CM

## 2017-07-16 DIAGNOSIS — H8111 Benign paroxysmal vertigo, right ear: Secondary | ICD-10-CM

## 2017-07-16 NOTE — Patient Instructions (Signed)
Hold on the Brandt-Daroff through the weekend and then on Monday: if you are still feeling woozy re-start Brandt-Daroff Monday and Tuesday.  Continue to perform balance and letter exercises.

## 2017-07-16 NOTE — Therapy (Signed)
[facility] [address] [place], [place], [address] Phone: [phone]   Fax:  [phone]  Physical Therapy Treatment  Patient Details  Name: Nicole [name] MRN: [id number] Date of Birth: 25-Aug-1933 Referring Provider: Harlan Stains, MD   Encounter Date: 07/16/2017  PT End of Session - 07/16/17 1127    Visit Number  5    Number of Visits  17    Date for PT Re-Evaluation  08/24/17 reassess LTG at 4 weeks to determine need for final visits    Authorization Type  [facility] Medicare: $40 copay; G code and PN every 10th visit    PT Start Time  0930    PT Stop Time  1016    PT Time Calculation (min)  46 min    Activity Tolerance  Patient tolerated treatment well    Behavior During Therapy  [facility] for tasks assessed/performed       Past Medical History:  Diagnosis Date  . Abscess of parotid gland 01/2011   w/parotid stones  . 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 Depression   . Heartburn   . IBS (irritable bowel syndrome)   . Non Hodgkin's lymphoma ([place])   . Personal history of colonic polyps 04/13/2007   hyperplastic  . Positional vertigo   . Reflux   . Sjogren's syndrome [facility])     Past Surgical History:  Procedure Laterality Date  . APPENDECTOMY    . BREAST BIOPSY Right 1986   Benign   . CATARACT EXTRACTION  2008 &2010   bilateral  . iredectomy  2008   left  . LUNG REMOVAL, PARTIAL  04/1999   right lung  . OOPHORECTOMY  2000  . SALIVARY GLAND SURGERY  01/2011 & 04/2011  . TONSILLECTOMY AND ADENOIDECTOMY    . VEIN SURGERY  1966    There were no vitals filed for this visit.  Subjective Assessment - 07/16/17 0933    Subjective  Had a hard time with hallway exercises last night but it was very late when she did them.  Was not dizzy this morning until she came into the waiting area and was standing at the desk to pay her bill.  Still feeling a little woozy.    Pertinent History  CHF, IBS, Non-Hodgkin lymphoma s/p R  upper and middle lobe pneumonectomy in 2000, L knee pain, tinnitus, h/o BPPV, osteopenia and HTN    Patient Stated Goals  To decrease the severity of dizziness. "I've lived with this for a long time".    Currently in Pain?  No/denies             Vestibular Assessment - 07/16/17 0947      Positional Testing   Dix-Hallpike  Dix-Hallpike Right    Sidelying Test  Sidelying Right;Sidelying Left      Dix-Hallpike Right   Dix-Hallpike Right Duration  15 seconds pt suppressing nystagmus until EC    Dix-Hallpike Right Symptoms  Upbeat, right rotatory nystagmus converted from anterior canal to posterior canal      Sidelying Right   Sidelying Right Duration  0    Sidelying Right Symptoms  No nystagmus      Sidelying Left   Sidelying Left Duration  0    Sidelying Left Symptoms  No nystagmus               Vestibular Treatment/Exercise - 07/16/17 0948      Vestibular Treatment/Exercise   Vestibular Treatment Provided  Canalith Repositioning  Canalith Repositioning  Epley Manuever Right       EPLEY MANUEVER RIGHT   Number of Reps   3    Overall Response  Improved Symptoms    Response Details   with second treatment pt presented with a mixture of upbeat, R rotary nystagmus followed by downbeat possibly indicating multi canal involvement.  Third treatment only mild downbeat nystagmus noted.  Pt is able to suppress nystagmus so in order to see nystagmus pt must close eyes during first part of test to remove fixation      X1 Viewing Horizontal   Foot Position  seated    Reps  1    Comments  60 seconds      X1 Viewing Vertical   Foot Position  seated    Reps  1    Comments  60 seconds        Hold on the Brandt-Daroff through the weekend and then on Monday: if you are still feeling woozy re-start Brandt-Daroff Monday and Tuesday.  Continue to perform balance and letter exercises.     PT Education - 07/16/17 1126    Education provided  Yes    Education Details   multi-canal BPPV, hold on brandt-daroff through weekend    Person(s) Educated  Patient    Methods  Explanation;Handout    Comprehension  Verbalized understanding       PT Short Term Goals - 07/16/17 0935      PT SHORT TERM GOAL #1   Title  Pt will participate in further assessment of hypofunction and balance with DVA, gait velocity and FGA    Time  2    Period  Weeks    Status  Achieved      PT SHORT TERM GOAL #2   Title  Pt will demonstrate independence with initial gaze adaptation, habituation and balance HEP    Time  2    Period  Weeks    Status  Achieved      PT SHORT TERM GOAL #3   Title  Pt will tolerate treatment of anterior canal cupulolithiasis and will report vertigo with provocative movements as <4/10    Baseline  multi canal BPPV on 07/16/2017 - treatment initiated    Time  2    Period  Weeks    Status  On-going        PT Long Term Goals - 07/09/17 1334      PT LONG TERM GOAL #1   Title  (60 day certification; re-assess LTG at week 4 to determine need for rest of visits)  Pt will demonstrate independence with ongoing HEP    Time  4    Period  Weeks    Status  On-going    Target Date  07/25/17      PT LONG TERM GOAL #2   Title  Pt will report 10 point decrease in [facility]    Baseline  30    Time  4    Period  Weeks    Status  On-going    Target Date  07/25/17      PT LONG TERM GOAL #3   Title  Pt will demonstrate 2/3 line difference on DVA     Baseline  4 line difference    Time  4    Period  Weeks    Status  On-going    Target Date  07/25/17      PT LONG TERM GOAL #4   Title  Pt  will demonstrate negative positional testing    Time  4    Period  Weeks    Status  Revised    Target Date  07/25/17      PT LONG TERM GOAL #5   Title  Will increase FGA to > or = 22/30 to decrease falls risk    Baseline  16/30    Time  4    Period  Weeks    Status  Revised    Target Date  07/25/17      PT LONG TERM GOAL #6   Title  Will improve gait velocity to >  or = 3.2 ft/sec to decrease falls risk during community ambulation    Baseline  3.0 ft/sec    Time  4    Period  Weeks    Status  Revised    Target Date  07/25/17      PT LONG TERM GOAL #7   Title  Pt will report 0-1 intensity on MSQ for bending, head turns, nods and pivot in standing due to improved motion tolerance    Baseline  1-2 intensity    Time  4    Period  Weeks    Status  Revised    Target Date  07/25/17            Plan - 07/16/17 1127    Clinical Impression Statement  Initiated assessment of STG to determine focus of treatment for next two weeks.  Pt has met 2/3 LTG with positional vertigo goal ongoing due to presence of multi-canal BPPV on R side requiring multiple treatments.  Pt presented with R posterior canal canalithiasis today - treated x 3 with CRM and continued to present with downbeat nystagmus once posterior canal cleared.  Will address anterior canal next session.  Continued to review x 1 viewing in sitting but pt demonstrated improved tolerance today and may be ready to progress to standing next session.    Rehab Potential  Good    PT Frequency  2x / week    PT Duration  8 weeks will reassess goals at 4 weeks to determine need for more visits    PT Treatment/Interventions  ADLs/Self Care Home Management;Canalith Repostioning;Gait training;Stair training;Functional mobility training;Therapeutic activities;Therapeutic exercise;Balance training;Neuromuscular re-education;Patient/family education;Vestibular    PT Next Visit Plan  re-assess R ant/post canals and treat as indicated; progress x 1 viewing to standing    Consulted and Agree with Plan of Care  Patient       Patient will benefit from skilled therapeutic intervention in order to improve the following deficits and impairments:  Decreased balance, Dizziness, Difficulty walking  Visit Diagnosis: Unsteadiness on feet  Dizziness and giddiness  Difficulty in walking, not elsewhere classified  BPPV  (benign paroxysmal positional vertigo), right     Problem List Patient Active Problem List   Diagnosis Date Noted  . Chronic parotitis 11/29/2015  . Parotid sialolithiasis 11/29/2015  . Rhinitis, chronic 11/29/2015  . Arthritis 04/25/2015  . 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 History of biliary T-tube placement 04/25/2015  . Non Hodgkin's lymphoma ([place]) 04/25/2015  . Ocular rosacea 04/25/2015  . Chronic diastolic heart failure ([place]) 04/25/2014  . Essential hypertension 04/25/2014  . Hypertriglyceridemia 04/25/2014  . Positional vertigo   . Sjogren's disease ([place])   . IBS (irritable bowel syndrome)   . Reflux   . Depression   . Abnormal CAT scan 01/17/2013  . Chronic cough 01/17/2013  . 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 Marginal zone  lymphoma 07/17/2011  . Abscess of parotid gland 01/30/2011  . Personal history of colonic polyps 04/13/2007   Rico Junker, PT, DPT 07/16/17    11:35 AM    [facility] [address] [place], [place], [address] Phone: [phone]   Fax:  [phone]  Name: Nicole [name] MRN: [id number] Date of Birth: [birth year]

## 2017-07-21 ENCOUNTER — Ambulatory Visit: Payer: Medicare Other | Admitting: Physical Therapy

## 2017-07-21 ENCOUNTER — Encounter: Payer: Self-pay | Admitting: Physical Therapy

## 2017-07-21 DIAGNOSIS — H8111 Benign paroxysmal vertigo, right ear: Secondary | ICD-10-CM

## 2017-07-21 DIAGNOSIS — R262 Difficulty in walking, not elsewhere classified: Secondary | ICD-10-CM

## 2017-07-21 DIAGNOSIS — R2681 Unsteadiness on feet: Secondary | ICD-10-CM

## 2017-07-21 DIAGNOSIS — R42 Dizziness and giddiness: Secondary | ICD-10-CM

## 2017-07-21 NOTE — Patient Instructions (Addendum)
How to Perform the Epley Maneuver The Epley maneuver is an exercise that relieves symptoms of vertigo. Vertigo is the feeling that you or your surroundings are moving when they are not. When you feel vertigo, you may feel like the room is spinning and have trouble walking. Dizziness is a little different than vertigo. When you are dizzy, you may feel unsteady or light-headed. You can do this maneuver at home whenever you have symptoms of vertigo. You can do it up to 3 times a day until your symptoms go away. Even though the Epley maneuver may relieve your vertigo for a few weeks, it is possible that your symptoms will return. This maneuver relieves vertigo, but it does not relieve dizziness. What are the risks? If it is done correctly, the Epley maneuver is considered safe. Sometimes it can lead to dizziness or nausea that goes away after a short time. If you develop other symptoms, such as changes in vision, weakness, or numbness, stop doing the maneuver and call your health care provider. How to perform the Epley maneuver 1. Sit on the edge of a bed or table with your back straight and your legs extended or hanging over the edge of the bed or table. 2. Turn your head halfway toward the affected ear or side. 3. Lie backward quickly with your head turned until you are lying flat on your back. You may want to position a pillow under your shoulders. 4. Hold this position for 30 seconds. You may experience an attack of vertigo. This is normal. 5. Turn your head to the opposite direction until your unaffected ear is facing the floor. 6. Hold this position for 30 seconds. You may experience an attack of vertigo. This is normal. Hold this position until the vertigo stops. 7. Turn your whole body to the same side as your head. Hold for another 30 seconds. 8. Sit back up. You can repeat this exercise up to 3 times a day. Follow these instructions at home:  After doing the Epley maneuver, you can return to  your normal activities.  Ask your health care provider if there is anything you should do at home to prevent vertigo. He or she may recommend that you: ? Keep your head raised (elevated) with two or more pillows while you sleep. ? Do not sleep on the side of your affected ear. ? Get up slowly from bed. ? Avoid sudden movements during the day. ? Avoid extreme head movement, like looking up or bending over. Contact a health care provider if:  Your vertigo gets worse.  You have other symptoms, including: ? Nausea. ? Vomiting. ? Headache. Get help right away if:  You have vision changes.  You have a severe or worsening headache or neck pain.  You cannot stop vomiting.  You have new numbness or weakness in any part of your body. Summary  Vertigo is the feeling that you or your surroundings are moving when they are not.  The Epley maneuver is an exercise that relieves symptoms of vertigo.  If the Epley maneuver is done correctly, it is considered safe. You can do it up to 3 times a day. This information is not intended to replace advice given to you by your health care provider. Make sure you discuss any questions you have with your health care provider. Document Released: 08/22/2013 Document Revised: 07/07/2016 Document Reviewed: 07/07/2016 Elsevier Interactive Patient Education  2017 [place] this exercise ONLY IF YOU FEEL  A STRONG SENSE OF SPINNING WHEN PERFORMING BRANDT-DAROFF (sidelying with head turned) -Perform two repetitions, one time a day.  Wait two minutes between repetitions    Gaze Stabilization - Tip Card  1.Target must remain in focus, not blurry, and appear stationary while head is in motion. 2.Perform exercises with small head movements (45 to either side of midline). 3.Increase speed of head motion so long as target is in focus. 4.If you wear eyeglasses, be sure you can see target through lens (therapist will give specific instructions  for bifocal / progressive lenses). 5.These exercises may provoke dizziness or nausea. Work through these symptoms. If too dizzy, slow head movement slightly. Rest between each exercise. 6.Exercises demand concentration; avoid distractions. 7.For safety, perform standing exercises close to a counter, wall, corner, or next to someone.  Copyright  VHI. All rights reserved.   Gaze Stabilization - Standing Feet Apart   Feet shoulder width apart, hands holding back of chair, keeping eyes on target on wall 3 feet away, tilt head down slightly and move head side to side for 60 seconds. Repeat while moving head up and down for 60 seconds.  Do 2-3 sessions per day.

## 2017-07-21 NOTE — Therapy (Signed)
[facility] [address] [place], [place], [address] Phone: [phone]   Fax:  [phone]  Physical Therapy Treatment  Patient Details  Name: Nicole [name] MRN: [id number] Date of Birth: [birth year] Referring Provider: Harlan Stains, MD   Encounter Date: 07/21/2017  PT End of Session - 07/21/17 1926    Visit Number  6    Number of Visits  17    Date for PT Re-Evaluation  08/24/17 reassess LTG at 4 weeks to determine need for final visits    Authorization Type  [facility] Medicare: $40 copay; G code and PN every 10th visit    PT Start Time  1401    PT Stop Time  1448    PT Time Calculation (min)  47 min    Activity Tolerance  Patient tolerated treatment well    Behavior During Therapy  [facility] for tasks assessed/performed       Past Medical History:  Diagnosis Date  . Abscess of parotid gland 01/2011   w/parotid stones  . Cancer ([place]) 1992   NHL--BALT  . Depression   . Heartburn   . IBS (irritable bowel syndrome)   . Non Hodgkin's lymphoma ([place])   . Personal history of colonic polyps 04/13/2007   hyperplastic  . Positional vertigo   . Reflux   . Sjogren's syndrome [facility])     Past Surgical History:  Procedure Laterality Date  . APPENDECTOMY    . BREAST BIOPSY Right 1986   Benign   . CATARACT EXTRACTION  2008 &2010   bilateral  . iredectomy  2008   left  . LUNG REMOVAL, PARTIAL  04/1999   right lung  . OOPHORECTOMY  2000  . SALIVARY GLAND SURGERY  01/2011 & 04/2011  . TONSILLECTOMY AND ADENOIDECTOMY    . VEIN SURGERY  1966    There were no vitals filed for this visit.  Subjective Assessment - 07/21/17 1407    Subjective  Still having some dizziness with looking down and when performing Brandt-Daroff felt most symptomatic down on R side.    Pertinent History  CHF, IBS, Non-Hodgkin lymphoma s/p R upper and middle lobe pneumonectomy in 2000, L knee pain, tinnitus, h/o BPPV, osteopenia and HTN    Patient  Stated Goals  To decrease the severity of dizziness. "I've lived with this for a long time".    Currently in Pain?  No/denies trigger finger chronic pain             Vestibular Assessment - 07/21/17 1440      Positional Testing   Dix-Hallpike  Dix-Hallpike Right    Sidelying Test  Sidelying Left;Sidelying Right      Dix-Hallpike Right   Dix-Hallpike Right Duration  5 seconds    Dix-Hallpike Right Symptoms  Upbeat, right rotatory nystagmus      Sidelying Right   Sidelying Right Duration  3 seconds    Sidelying Right Symptoms  No nystagmus      Sidelying Left   Sidelying Left Duration  0    Sidelying Left Symptoms  No nystagmus               Vestibular Treatment/Exercise - 07/21/17 1441      Vestibular Treatment/Exercise   Vestibular Treatment Provided  Canalith Repositioning;Gaze       EPLEY MANUEVER RIGHT   Number of Reps   2    Overall Response  Improved Symptoms    Response Details   with pillow at upper  back      X1 Viewing Horizontal   Foot Position  standing feet apart, UE support on chair to keep shoulders stable    Reps  2    Comments  30, 60 seconds      X1 Viewing Vertical   Foot Position  standing feet apart, UE support on chair to keep shoulders stable    Reps  2    Comments  30, 60 seconds            PT Education - 07/21/17 1925    Education provided  Yes    Education Details  home Epley maneuver    Person(s) Educated  Patient    Methods  Explanation;Demonstration;Handout    Comprehension  Verbalized understanding;Returned demonstration     How to Perform the Epley Maneuver The Epley maneuver is an exercise that relieves symptoms of vertigo. Vertigo is the feeling that you or your surroundings are moving when they are not. When you feel vertigo, you may feel like the room is spinning and have trouble walking. Dizziness is a little different than vertigo. When you are dizzy, you may feel unsteady or light-headed. You can do this  maneuver at home whenever you have symptoms of vertigo. You can do it up to 3 times a day until your symptoms go away. Even though the Epley maneuver may relieve your vertigo for a few weeks, it is possible that your symptoms will return. This maneuver relieves vertigo, but it does not relieve dizziness. What are the risks? If it is done correctly, the Epley maneuver is considered safe. Sometimes it can lead to dizziness or nausea that goes away after a short time. If you develop other symptoms, such as changes in vision, weakness, or numbness, stop doing the maneuver and call your health care provider. How to perform the Epley maneuver 1. Sit on the edge of a bed or table with your back straight and your legs extended or hanging over the edge of the bed or table. 2. Turn your head halfway toward the affected ear or side. 3. Lie backward quickly with your head turned until you are lying flat on your back. You may want to position a pillow under your shoulders. 4. Hold this position for 30 seconds. You may experience an attack of vertigo. This is normal. 5. Turn your head to the opposite direction until your unaffected ear is facing the floor. 6. Hold this position for 30 seconds. You may experience an attack of vertigo. This is normal. Hold this position until the vertigo stops. 7. Turn your whole body to the same side as your head. Hold for another 30 seconds. 8. Sit back up. You can repeat this exercise up to 3 times a day. Follow these instructions at home:  After doing the Epley maneuver, you can return to your normal activities.  Ask your health care provider if there is anything you should do at home to prevent vertigo. He or she may recommend that you: ? Keep your head raised (elevated) with two or more pillows while you sleep. ? Do not sleep on the side of your affected ear. ? Get up slowly from bed. ? Avoid sudden movements during the day. ? Avoid extreme head movement, like looking  up or bending over. Contact a health care provider if:  Your vertigo gets worse.  You have other symptoms, including: ? Nausea. ? Vomiting. ? Headache. Get help right away if:  You have vision changes.  You  have a severe or worsening headache or neck pain.  You cannot stop vomiting.  You have new numbness or weakness in any part of your body. Summary  Vertigo is the feeling that you or your surroundings are moving when they are not.  The Epley maneuver is an exercise that relieves symptoms of vertigo.  If the Epley maneuver is done correctly, it is considered safe. You can do it up to 3 times a day. This information is not intended to replace advice given to you by your health care provider. Make sure you discuss any questions you have with your health care provider. Document Released: 08/22/2013 Document Revised: 07/07/2016 Document Reviewed: 07/07/2016 Elsevier Interactive Patient Education  2017 Elsevier Inc.     -Perform this exercise ONLY IF YOU FEEL A STRONG SENSE OF SPINNING WHEN PERFORMING BRANDT-DAROFF (sidelying with head turned) -Perform two repetitions, one time a day.  Wait two minutes between repetitions    Gaze Stabilization - Tip Card  1.Target must remain in focus, not blurry, and appear stationary while head is in motion. 2.Perform exercises with small head movements (45 to either side of midline). 3.Increase speed of head motion so long as target is in focus. 4.If you wear eyeglasses, be sure you can see target through lens (therapist will give specific instructions for bifocal / progressive lenses). 5.These exercises may provoke dizziness or nausea. Work through these symptoms. If too dizzy, slow head movement slightly. Rest between each exercise. 6.Exercises demand concentration; avoid distractions. 7.For safety, perform standing exercises close to a counter, wall, corner, or next to someone.  Copyright  VHI. All rights reserved.   Gaze  Stabilization - Standing Feet Apart   Feet shoulder width apart, hands holding back of chair, keeping eyes on target on wall 3 feet away, tilt head down slightly and move head side to side for 60 seconds. Repeat while moving head up and down for 60 seconds.  Do 2-3 sessions per day.   PT Short Term Goals - 07/16/17 0935      PT SHORT TERM GOAL #1   Title  Pt will participate in further assessment of hypofunction and balance with DVA, gait velocity and FGA    Time  2    Period  Weeks    Status  Achieved      PT SHORT TERM GOAL #2   Title  Pt will demonstrate independence with initial gaze adaptation, habituation and balance HEP    Time  2    Period  Weeks    Status  Achieved      PT SHORT TERM GOAL #3   Title  Pt will tolerate treatment of anterior canal cupulolithiasis and will report vertigo with provocative movements as <4/10    Baseline  multi canal BPPV on 07/16/2017 - treatment initiated    Time  2    Period  Weeks    Status  On-going        PT Long Term Goals - 07/09/17 1334      PT LONG TERM GOAL #1   Title  (60 day certification; re-assess LTG at week 4 to determine need for rest of visits)  Pt will demonstrate independence with ongoing HEP    Time  4    Period  Weeks    Status  On-going    Target Date  07/25/17      PT LONG TERM GOAL #2   Title  Pt will report 10 point decrease in [facility]  Baseline  30    Time  4    Period  Weeks    Status  On-going    Target Date  07/25/17      PT LONG TERM GOAL #3   Title  Pt will demonstrate 2/3 line difference on DVA     Baseline  4 line difference    Time  4    Period  Weeks    Status  On-going    Target Date  07/25/17      PT LONG TERM GOAL #4   Title  Pt will demonstrate negative positional testing    Time  4    Period  Weeks    Status  Revised    Target Date  07/25/17      PT LONG TERM GOAL #5   Title  Will increase FGA to > or = 22/30 to decrease falls risk    Baseline  16/30    Time  4    Period   Weeks    Status  Revised    Target Date  07/25/17      PT LONG TERM GOAL #6   Title  Will improve gait velocity to > or = 3.2 ft/sec to decrease falls risk during community ambulation    Baseline  3.0 ft/sec    Time  4    Period  Weeks    Status  Revised    Target Date  07/25/17      PT LONG TERM GOAL #7   Title  Pt will report 0-1 intensity on MSQ for bending, head turns, nods and pivot in standing due to improved motion tolerance    Baseline  1-2 intensity    Time  4    Period  Weeks    Status  Revised    Target Date  07/25/17            Plan - 07/21/17 1927    Clinical Impression Statement  Treatment session today focused on re-assessment of peripheral canals, continued treatment of R BPPV, education on home Epley maneuver and progression of x 1 viewing to standing.  Pt tolerated well with improved symptoms of vertigo after one treatment of CRM.  Will continue to address and progress towards LTG.       Rehab Potential  Good    PT Frequency  2x / week    PT Duration  8 weeks will reassess goals at 4 weeks to determine need for more visits    PT Treatment/Interventions  ADLs/Self Care Home Management;Canalith Repostioning;Gait training;Stair training;Functional mobility training;Therapeutic activities;Therapeutic exercise;Balance training;Neuromuscular re-education;Patient/family education;Vestibular    PT Next Visit Plan  check LTG to determine if pt needs 4 more weeks; schedule visits? progress x 1 viewing, dynamic balance training and balance reaction training    Consulted and Agree with Plan of Care  Patient       Patient will benefit from skilled therapeutic intervention in order to improve the following deficits and impairments:  Decreased balance, Dizziness, Difficulty walking  Visit Diagnosis: Unsteadiness on feet  Dizziness and giddiness  Difficulty in walking, not elsewhere classified  BPPV (benign paroxysmal positional vertigo), right     Problem  List Patient Active Problem List   Diagnosis Date Noted  . Chronic parotitis 11/29/2015  . Parotid sialolithiasis 11/29/2015  . Rhinitis, chronic 11/29/2015  . Arthritis 04/25/2015  . 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 History of biliary T-tube placement 04/25/2015  . Non Hodgkin's lymphoma ([place]) 04/25/2015  .  Ocular rosacea 04/25/2015  . Chronic diastolic heart failure ([place]) 04/25/2014  . Essential hypertension 04/25/2014  . Hypertriglyceridemia 04/25/2014  . Positional vertigo   . Sjogren's disease ([place])   . IBS (irritable bowel syndrome)   . Reflux   . Depression   . Abnormal CAT scan 01/17/2013  . Chronic cough 01/17/2013  . 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 Marginal zone lymphoma 07/17/2011  . Abscess of parotid gland 01/30/2011  . Personal history of colonic polyps 04/13/2007   Rico Junker, PT, DPT 07/21/17    7:39 PM    [facility] [address] [place], [place], [address] Phone: [phone]   Fax:  [phone]  Name: Nicole [name] MRN: [id number] Date of Birth: Jun 27, 1933

## 2017-07-29 ENCOUNTER — Ambulatory Visit: Payer: Medicare Other | Admitting: Physical Therapy

## 2017-07-30 ENCOUNTER — Ambulatory Visit: Payer: Medicare Other | Admitting: Physical Therapy

## 2017-07-30 ENCOUNTER — Encounter: Payer: Self-pay | Admitting: Physical Therapy

## 2017-07-30 ENCOUNTER — Ambulatory Visit: Payer: Medicare Other | Admitting: Cardiology

## 2017-07-30 ENCOUNTER — Encounter: Payer: Self-pay | Admitting: Cardiology

## 2017-07-30 VITALS — BP 122/64 | HR 72 | Ht 63.0 in | Wt 152.0 lb

## 2017-07-30 DIAGNOSIS — I5032 Chronic diastolic (congestive) heart failure: Secondary | ICD-10-CM | POA: Diagnosis not present

## 2017-07-30 DIAGNOSIS — E781 Pure hyperglyceridemia: Secondary | ICD-10-CM

## 2017-07-30 DIAGNOSIS — R2681 Unsteadiness on feet: Secondary | ICD-10-CM

## 2017-07-30 DIAGNOSIS — I10 Essential (primary) hypertension: Secondary | ICD-10-CM

## 2017-07-30 DIAGNOSIS — R42 Dizziness and giddiness: Secondary | ICD-10-CM

## 2017-07-30 DIAGNOSIS — R262 Difficulty in walking, not elsewhere classified: Secondary | ICD-10-CM

## 2017-07-30 DIAGNOSIS — H8111 Benign paroxysmal vertigo, right ear: Secondary | ICD-10-CM

## 2017-07-30 MED ORDER — HYDROCHLOROTHIAZIDE 12.5 MG PO CAPS: 12.5000 mg | ORAL_CAPSULE | Freq: Every day | ORAL | 3 refills | Status: DC

## 2017-07-30 MED ORDER — METOPROLOL SUCCINATE ER 25 MG PO TB24: 25.0000 mg | ORAL_TABLET | Freq: Every day | ORAL | 3 refills | Status: DC

## 2017-07-30 NOTE — Patient Instructions (Signed)

## 2017-07-30 NOTE — Progress Notes (Signed)
[facility]. [address], [place] [address], [place]  [address] Phone: [phone] Fax:  [phone]  Date:  07/30/2017   ID:  Kareen, Jefferys 07/07/1933, MRN [id number]  PCP:  Harlan Stains, MD   History of Present Illness: Nicole [name] is a 83 y.o. female with hypertriglyceridemia, diastolic dysfunction, parotid gland disease. Diastolic dysfunction-on low-dose metoprolol. Prior uphill SOB has increased, moderate in severity, relieved with rest, no associated symptoms of chest discomfort or heaviness. CT of chest showed 2 small areas of concern. Dr. Arlyce Dice performed surgery.   She has had hypertriglyceridemia. This is improved significantly with fish oil. Down into the 150-190 range.  Traveling often. [place] trips. She stays in youth hostils. Enjoying time with her friend Shanon Brow.  [place] travel.   Lymphoma. Eye. 5 chemo tx. Rituxan. [facility].  Last 10/2014  Noted SOB with inclines (declined since knee pain has limited walking). No CP. No real change in symptoms. Parotid gland issues have resolved.  Her blood pressure was slightly elevated at home. She showed me home values. We decided to add back on HCTZ 12.5 mg.  07/30/17 - brother CABG 70.  Overall she is doing very well.  No chest pain, no significant change in shortness of breath, only when exerting herself significantly.  She had to curtail her travels recently, her significant other's dog has been sick.  She is going to a destination wedding in [place] soon.  Denies any syncope, bleeding, orthopnea, PND.   Wt Readings from Last 3 Encounters:  07/30/17 152 lb (68.9 kg)  07/29/16 151 lb 3.2 oz (68.6 kg)  04/25/15 149 lb (67.6 kg)     Past Medical History:  Diagnosis Date  . Abscess of parotid gland 01/2011   w/parotid stones  . Cancer ([place]) 1992   NHL--BALT  . Depression   . Heartburn   . IBS (irritable bowel syndrome)   . Non Hodgkin's lymphoma ([place])   . Personal history of colonic  polyps 04/13/2007   hyperplastic  . Positional vertigo   . Reflux   . Sjogren's syndrome [facility])     Past Surgical History:  Procedure Laterality Date  . APPENDECTOMY    . BREAST BIOPSY Right 1986   Benign   . CATARACT EXTRACTION  2008 &2010   bilateral  . iredectomy  2008   left  . LUNG REMOVAL, PARTIAL  04/1999   right lung  . OOPHORECTOMY  2000  . SALIVARY GLAND SURGERY  01/2011 & 04/2011  . TONSILLECTOMY AND ADENOIDECTOMY    . VEIN SURGERY  1966    Current Outpatient Medications  Medication Sig Dispense Refill  . aspirin 81 MG tablet Take 81 mg by mouth daily.      . Biotin 1000 MCG tablet Take 1,000 mcg by mouth daily.    . Calcium Carbonate-Vitamin D (CALCIUM-VITAMIN D) 600-200 MG-UNIT CAPS Take 1 tablet by mouth daily.     . Cholecalciferol (VITAMIN D3) 1000 UNITS CAPS Take 1,000 Units by mouth daily.      Marland Kitchen doxycycline (VIBRAMYCIN) 100 MG capsule Take 500 mg by mouth 2 (two) times daily.    . Flaxseed, Linseed, (FLAX SEED OIL) 1000 MG CAPS Take 1,000 mg by mouth daily.      . Glucosamine 750 MG TABS Take 750 mg by mouth daily.    . Methylsulfonylmethane (MSM) 1000 MG CAPS Take 1,000 mg by mouth daily.      . metoprolol succinate (TOPROL-XL)  25 MG 24 hr tablet Take 1 tablet (25 mg total) by mouth daily. 90 tablet 3  . Multiple Vitamin (MULTIVITAMIN) capsule Take 1 capsule by mouth daily.      . OMEGA 3 1000 MG CAPS Take 1,000 mg by mouth 2 (two) times daily.      [name] Glycol-Propyl Glycol (SYSTANE OP) Apply 1 drop to eye as needed. Both eyes    . Polyethyl Glycol-Propyl Glycol 0.4-0.3 % SOLN Place 1 drop into both eyes once.    Marland Kitchen PREMARIN 0.45 MG tablet Take 1 tablet by mouth daily.    . vitamin C (ASCORBIC ACID) 500 MG tablet Take 500 mg by mouth daily.      . hydrochlorothiazide (MICROZIDE) 12.5 MG capsule Take 1 capsule (12.5 mg total) by mouth daily. 90 capsule 3   No current facility-administered medications for this visit.     Allergies:    Allergies    Allergen Reactions  . Ace Inhibitors     cough  . Protonix [Pantoprazole Sodium]     Dizziness    Social History:  The patient  reports that she has quit smoking. she has never used smokeless tobacco. She reports that she does not drink alcohol or use drugs.   Family History  Problem Relation Age of Onset  . Celiac disease Mother   . Heart disease Mother   . Heart disease Father   . Heart failure Father   . Kidney cancer Daughter     ROS:  Please see the history of present illness.  Her tinnitus has been chronic for years.  All other review of systems negative.  PHYSICAL EXAM: VS:  BP 122/64   Pulse 72   Ht 5\' 3"  (1.6 m)   Wt 152 lb (68.9 kg)   LMP  (LMP Unknown)   BMI 26.93 kg/m  GEN: Well nourished, well developed, in no acute distress  HEENT: normal except for dermatologic bandages for basal cell. Neck: no JVD, carotid bruits, or masses Cardiac: RRR; 2/6 SM, no rubs, or gallops,no edema  Respiratory:  clear to auscultation bilaterally, normal work of breathing GI: soft, nontender, nondistended, + BS MS: no deformity or atrophy  Skin: warm and dry, no rash Neuro:  Alert and Oriented x 3, Strength and sensation are intact Psych: euthymic mood, full affect   EKG:  Today- 07/30/17-sinus rhythm 72, small inferior Q waves in 3 and aVF, overall borderline low voltage.  Personally viewed no significant changes.  07/29/16-sinus rhythm, 67, no other abnormalities. Personally viewed. 04/25/15-sinus rhythm, 63, low voltage, poor R-wave progression, personally viewed-04/25/14-sinus rhythm, no other abnormalities, baseline artifact.  ECHO: 07/21/12 - normal ejection fraction, calcified aortic valve (question murmur), trace MR, diastolic dysfunction grade 1  NUC: 07/21/12 - low risk, no ischemia, normal EF.  ASSESSMENT AND PLAN:  1. Chronic diastolic heart failure-symptoms of shortness of breath has been chronic but improved. Notices when in a big hurry.  Fairly well controlled.  Echocardiogram reassuring. Multifactorial also from deconditioning. Continue with metoprolol. Continue to monitor. Previous nuclear stress test reassuring.  No changes, continue with current therapy.  Echocardiogram reassuring. CT scan of chest reassuring with no parenchymal lung disease. No pericardial effusion. 2. Hypertriglyceridemia-previous excellent response to fish oil.  Continue with diet as well.  Triglycerides most recently worked to 39. 3. Hypertension-restarting HCTZ 12.5 mg. Continue with current metoprolol dose. Pulse is been in the 50s to 60s in the past, currently 70s. Medications reviewed.  No changes made. 4. Lymphoma -  ended therapy in 10/2014 - [facility]. Doing well. Parotid Dr. Candis Schatz.  Thankfully, she continues to be in remission. 5. Knee pain - Sees Dr. Latanya Maudlin.  6. Basal cell carcinoma of skin-sees Dr. Sarajane Jews of dermatology. 7. One-year followup.  Signed, Candee Furbish, MD [facility]  07/30/2017 8:45 AM

## 2017-07-30 NOTE — Therapy (Signed)
[facility] [address] [place], [place], [address] Phone: [phone]   Fax:  [phone]  Physical Therapy Treatment  Patient Details  Name: Nicole [name] MRN: [id number] Date of Birth: 1933-06-01 Referring Provider: Harlan Stains, MD   Encounter Date: 07/30/2017  PT End of Session - 07/30/17 1109    Visit Number  7    Number of Visits  17    Date for PT Re-Evaluation  08/24/17    Authorization Type  [employer] Medicare: $40 copay; G code and PN every 10th visit    PT Start Time  1015    PT Stop Time  1100    PT Time Calculation (min)  45 min    Activity Tolerance  Patient tolerated treatment well    Behavior During Therapy  [facility] for tasks assessed/performed       Past Medical History:  Diagnosis Date  . Abscess of parotid gland 01/2011   w/parotid stones  . 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 Depression   . Heartburn   . IBS (irritable bowel syndrome)   . Non Hodgkin's lymphoma ([place])   . Personal history of colonic polyps 04/13/2007   hyperplastic  . Positional vertigo   . Reflux   . Sjogren's syndrome [facility])     Past Surgical History:  Procedure Laterality Date  . APPENDECTOMY    . BREAST BIOPSY Right 1986   Benign   . CATARACT EXTRACTION  2008 &2010   bilateral  . iredectomy  2008   left  . LUNG REMOVAL, PARTIAL  04/1999   right lung  . OOPHORECTOMY  2000  . SALIVARY GLAND SURGERY  01/2011 & 04/2011  . TONSILLECTOMY AND ADENOIDECTOMY    . VEIN SURGERY  1966    There were no vitals filed for this visit.  Subjective Assessment - 07/30/17 1016    Subjective  doing well; feels less dizzy.  not having symptoms with lying down or rolling over.      Pertinent History  CHF, IBS, Non-Hodgkin lymphoma s/p R upper and middle lobe pneumonectomy in 2000, L knee pain, tinnitus, h/o BPPV, osteopenia and HTN    Patient Stated Goals  To decrease the severity of dizziness. "I've lived with this for a long time".     Currently in Pain?  No/denies         [facility] PT Assessment - 07/30/17 1029      Assessment   Medical Diagnosis  BPPV      Observation/Other Assessments   Focus on Therapeutic Outcomes (FOTO)   86 (14% limited)    Dizziness Handicap Inventory (DHI)   10      Ambulation/Gait   Gait velocity  3.88 ft/sec 66m 8.45 sec      Functional Gait  Assessment   Gait assessed   Yes    Gait Level Surface  Walks 20 ft in less than 7 sec but greater than 5.5 sec, uses assistive device, slower speed, mild gait deviations, or deviates 6-10 in outside of the 12 in walkway width.    Change in Gait Speed  Able to smoothly change walking speed without loss of balance or gait deviation. Deviate no more than 6 in outside of the 12 in walkway width.    Gait with Horizontal Head Turns  Performs head turns smoothly with no change in gait. Deviates no more than 6 in outside 12 in walkway width    Gait with Vertical Head Turns  Performs head turns with no change in gait. Deviates no more than 6 in outside 12 in walkway width.    Gait and Pivot Turn  Pivot turns safely within 3 sec and stops quickly with no loss of balance.    Step Over Obstacle  Is able to step over 2 stacked shoe boxes taped together (9 in total height) without changing gait speed. No evidence of imbalance.    Gait with Narrow Base of Support  Ambulates less than 4 steps heel to toe or cannot perform without assistance.    Gait with Eyes Closed  Walks 20 ft, slow speed, abnormal gait pattern, evidence for imbalance, deviates 10-15 in outside 12 in walkway width. Requires more than 9 sec to ambulate 20 ft.    Ambulating Backwards  Walks 20 ft, uses assistive device, slower speed, mild gait deviations, deviates 6-10 in outside 12 in walkway width.    Steps  Alternating feet, must use rail.    Total Score  22         Vestibular Assessment - 07/30/17 1106      Positional Testing   Sidelying Test  Sidelying Left;Sidelying Right    Horizontal  Canal Testing  Horizontal Canal Right;Horizontal Canal Left      Sidelying Right   Sidelying Right Duration  4-5 seconds    Sidelying Right Symptoms  No nystagmus      Sidelying Left   Sidelying Left Duration  6-7 sec    Sidelying Left Symptoms  Upbeat, left rotatory nystagmus very slow; pt declined CRT due to stitches      Horizontal Canal Right   Horizontal Canal Right Duration  none    Horizontal Canal Right Symptoms  Normal      Horizontal Canal Left   Horizontal Canal Left Duration  none    Horizontal Canal Left Symptoms  Normal               Vestibular Treatment/Exercise - 07/30/17 1107      Vestibular Treatment/Exercise   Vestibular Treatment Provided  Gaze    Gaze Exercises  X1 Viewing Horizontal;X1 Viewing Vertical      X1 Viewing Horizontal   Foot Position  feet apart on compliant surface    Time  -- 30 sec    Reps  1    Comments  cues to increase head motion      X1 Viewing Vertical   Foot Position  feet apart on compliant surface    Time  -- 30 sec    Reps  1    Comments  cues to increase head motion            PT Education - 07/30/17 1108    Education provided  Yes    Education Details  verbally reviewed HEP and progressed gaze to standing on compliant surface    Person(s) Educated  Patient    Methods  Explanation;Demonstration    Comprehension  Verbalized understanding;Returned demonstration       PT Short Term Goals - 07/16/17 0935      PT SHORT TERM GOAL #1   Title  Pt will participate in further assessment of hypofunction and balance with DVA, gait velocity and FGA    Time  2    Period  Weeks    Status  Achieved      PT SHORT TERM GOAL #2   Title  Pt will demonstrate independence with initial gaze adaptation, habituation and balance HEP  Time  2    Period  Weeks    Status  Achieved      PT SHORT TERM GOAL #3   Title  Pt will tolerate treatment of anterior canal cupulolithiasis and will report vertigo with provocative  movements as <4/10    Baseline  multi canal BPPV on 07/16/2017 - treatment initiated    Time  2    Period  Weeks    Status  On-going        PT Long Term Goals - 07/30/17 1109      PT LONG TERM GOAL #1   Title  (60 day certification; re-assess LTG at week 4 to determine need for rest of visits)  Pt will demonstrate independence with ongoing HEP    Baseline  11/30: met to date    Status  On-going    Target Date  08/27/17      PT LONG TERM GOAL #2   Title  Pt will report 10 point decrease in [place]    Status  Achieved      PT LONG TERM GOAL #3   Title  Pt will demonstrate 2/3 line difference on DVA     Status  On-going    Target Date  08/27/17      PT LONG TERM GOAL #4   Title  Pt will demonstrate negative positional testing    Baseline  11/30: continues to be symptomatic; deferred repositioning today    Status  On-going    Target Date  08/27/17      PT LONG TERM GOAL #5   Title  Will increase FGA to > or = 22/30 to decrease falls risk    Status  Achieved      PT LONG TERM GOAL #6   Title  Will improve gait velocity to > or = 3.2 ft/sec to decrease falls risk during community ambulation    Status  Achieved      PT LONG TERM GOAL #7   Title  Pt will report 0-1 intensity on MSQ for bending, head turns, nods and pivot in standing due to improved motion tolerance    Status  On-going    Target Date  08/27/17            Plan - 07/30/17 1110    Clinical Impression Statement  Session today focused on assessment of LTGs and current progress.  Pt has demonstrated great progress meeting 3/7 LTGs with additional goals ongoing.  Progressed gaze today for HEP.  Pt requesting to continue through scheduled visits (2x/wk x 2 wks) and still has some residual BPPV to address.  She currently has stitches in forehead so deferred CRM today at her request.  Progressing well with PT and anticipate pt will be ready for d/c at end of scheduled visits.    PT Treatment/Interventions  ADLs/Self  Care Home Management;Canalith Repostioning;Gait training;Stair training;Functional mobility training;Therapeutic activities;Therapeutic exercise;Balance training;Neuromuscular re-education;Patient/family education;Vestibular    PT Next Visit Plan  reassess BPPV and tx as indicated; cotninue dynamic balance exercises    Consulted and Agree with Plan of Care  Patient       Patient will benefit from skilled therapeutic intervention in order to improve the following deficits and impairments:  Decreased balance, Dizziness, Difficulty walking  Visit Diagnosis: Unsteadiness on feet  Dizziness and giddiness  Difficulty in walking, not elsewhere classified  BPPV (benign paroxysmal positional vertigo), right     Problem List Patient Active Problem List   Diagnosis Date Noted  .  Chronic parotitis 11/29/2015  . Parotid sialolithiasis 11/29/2015  . Rhinitis, chronic 11/29/2015  . Arthritis 04/25/2015  . 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 History of biliary T-tube placement 04/25/2015  . Non Hodgkin's lymphoma ([place]) 04/25/2015  . Ocular rosacea 04/25/2015  . Chronic diastolic heart failure ([place]) 04/25/2014  . Essential hypertension 04/25/2014  . Hypertriglyceridemia 04/25/2014  . Positional vertigo   . Sjogren's disease ([place])   . IBS (irritable bowel syndrome)   . Reflux   . Depression   . Abnormal CAT scan 01/17/2013  . Chronic cough 01/17/2013  . 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 Marginal zone lymphoma 07/17/2011  . Abscess of parotid gland 01/30/2011  . Personal history of colonic polyps 04/13/2007      Laureen Abrahams, PT, DPT 07/30/17 11:16 AM    [facility] [address] [place], [place], [address] Phone: [phone]   Fax:  [phone]  Name: Nicole [name] MRN: [id number] Date of Birth: [birth year]

## 2017-08-04 ENCOUNTER — Ambulatory Visit: Payer: Medicare Other | Attending: Family Medicine | Admitting: Physical Therapy

## 2017-08-04 ENCOUNTER — Encounter: Payer: Self-pay | Admitting: Physical Therapy

## 2017-08-04 DIAGNOSIS — H8111 Benign paroxysmal vertigo, right ear: Secondary | ICD-10-CM | POA: Diagnosis present

## 2017-08-04 DIAGNOSIS — R262 Difficulty in walking, not elsewhere classified: Secondary | ICD-10-CM | POA: Diagnosis present

## 2017-08-04 DIAGNOSIS — R42 Dizziness and giddiness: Secondary | ICD-10-CM | POA: Diagnosis present

## 2017-08-04 DIAGNOSIS — R2681 Unsteadiness on feet: Secondary | ICD-10-CM | POA: Diagnosis not present

## 2017-08-04 NOTE — Therapy (Signed)
[facility] [address] [place], [place], [address] Phone: [phone]   Fax:  [phone]  Physical Therapy Treatment  Patient Details  Name: Nicole [name] MRN: [id number] Date of Birth: 1933-02-05 Referring Provider: Harlan Stains, MD   Encounter Date: 08/04/2017  PT End of Session - 08/04/17 1257    Visit Number  8    Number of Visits  17    Date for PT Re-Evaluation  08/24/17    Authorization Type  [employer] Medicare: $40 copay; G code and PN every 10th visit    PT Start Time  0936    PT Stop Time  1018    PT Time Calculation (min)  42 min    Activity Tolerance  Patient tolerated treatment well    Behavior During Therapy  [facility] for tasks assessed/performed       Past Medical History:  Diagnosis Date  . Abscess of parotid gland 01/2011   w/parotid stones  . 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 Depression   . Heartburn   . IBS (irritable bowel syndrome)   . Non Hodgkin's lymphoma ([place])   . Personal history of colonic polyps 04/13/2007   hyperplastic  . Positional vertigo   . Reflux   . Sjogren's syndrome [facility])     Past Surgical History:  Procedure Laterality Date  . APPENDECTOMY    . BREAST BIOPSY Right 1986   Benign   . CATARACT EXTRACTION  2008 &2010   bilateral  . iredectomy  2008   left  . LUNG REMOVAL, PARTIAL  04/1999   right lung  . OOPHORECTOMY  2000  . SALIVARY GLAND SURGERY  01/2011 & 04/2011  . TONSILLECTOMY AND ADENOIDECTOMY    . VEIN SURGERY  1966    There were no vitals filed for this visit.  Subjective Assessment - 08/04/17 0938    Subjective  Still doing well, still feels lightheaded with lying down to R during Brandt-Daroff, no vertigo.  Pt feels she is better overall.    Pertinent History  CHF, IBS, Non-Hodgkin lymphoma s/p R upper and middle lobe pneumonectomy in 2000, L knee pain, tinnitus, h/o BPPV, osteopenia and HTN    Patient Stated Goals  To decrease the severity of dizziness.  "I've lived with this for a long time".                       Vestibular Treatment/Exercise - 08/04/17 0950      Vestibular Treatment/Exercise   Vestibular Treatment Provided  Gaze    Habituation Exercises  Laruth Bouchard Daroff;Comment;Standing Vertical Head Turns    Gaze Exercises  X1 Viewing Horizontal;X1 Viewing Vertical      Nestor Lewandowsky   Number of Reps   3    Symptom Description   with one pillow utilizing breathing sequence inhaling while transitioning sit > supine and exhaling when transitioning from side > sit.  Pt reporting increased wooziness when repeating with breathing sequence.  Pt advised to keep performing her usual method at home      Standing Vertical Head Turns   Number of Reps   10    Symptom Description   utilizing breathing sequence while practicing reaching to floor <> up high for increased postural control during transitions; standing on solid surface and then repeating standing on compliant pillow all with supervision but verbal cues for sequencing      X1 Viewing Horizontal   Foot Position  feet together  on compliant pillow    Reps  1    Comments  60 seconds, mild symptoms afterwards      X1 Viewing Vertical   Foot Position  feet together on compliant pillow     Reps  1    Comments  60 seconds, mild symptoms afterwards            PT Education - 08/04/17 1256    Education provided  Yes    Education Details  Plan for D/C next week, progressed x 1 viewing    Person(s) Educated  Patient    Methods  Explanation    Comprehension  Verbalized understanding;Returned demonstration       PT Short Term Goals - 07/16/17 0935      PT SHORT TERM GOAL #1   Title  Pt will participate in further assessment of hypofunction and balance with DVA, gait velocity and FGA    Time  2    Period  Weeks    Status  Achieved      PT SHORT TERM GOAL #2   Title  Pt will demonstrate independence with initial gaze adaptation, habituation and balance HEP     Time  2    Period  Weeks    Status  Achieved      PT SHORT TERM GOAL #3   Title  Pt will tolerate treatment of anterior canal cupulolithiasis and will report vertigo with provocative movements as <4/10    Baseline  multi canal BPPV on 07/16/2017 - treatment initiated    Time  2    Period  Weeks    Status  On-going        PT Long Term Goals - 08/04/17 1254      PT LONG TERM GOAL #1   Title  Pt will demonstrate independence with ongoing HEP    Baseline  11/30: met to date    Status  On-going    Target Date  08/24/17      PT LONG TERM GOAL #2   Title  Pt will report 10 point decrease in [place]    Status  Achieved      PT LONG TERM GOAL #3   Title  Pt will demonstrate 2/3 line difference on DVA     Baseline  4 line difference    Status  On-going    Target Date  08/24/17      PT LONG TERM GOAL #4   Title  Pt will demonstrate negative positional testing    Baseline  11/30: continues to be symptomatic; deferred repositioning today    Status  On-going    Target Date  08/24/17      PT LONG TERM GOAL #5   Title  Will increase FGA to > or = 22/30 to decrease falls risk    Status  Achieved      PT LONG TERM GOAL #6   Title  Will improve gait velocity to > or = 3.2 ft/sec to decrease falls risk during community ambulation    Status  Achieved      PT LONG TERM GOAL #7   Title  Pt will report 0-1 intensity on MSQ for bending, head turns, nods and pivot in standing due to improved motion tolerance    Status  On-going    Target Date  08/24/17            Plan - 08/04/17 1020    Clinical Impression Statement  Treatment session today continued to  focus on habituation of motion sensitivities during sit <> supine/sidelying and when reaching down to ground and up high with use of breathing sequence for increased postural contol.  Also continued to progress x 1 viewing standing on compliant surface but with more narrow BOS.  Discussed importance of finding community wellness or  fitness program to participate in at D/C to maintain gains, balance and postural control.  Will continue to progress towards remaining LTG.    PT Treatment/Interventions  ADLs/Self Care Home Management;Canalith Repostioning;Gait training;Stair training;Functional mobility training;Therapeutic activities;Therapeutic exercise;Balance training;Neuromuscular re-education;Patient/family education;Vestibular    PT Next Visit Plan  finalize HEP, D/C after two visits next week    Consulted and Agree with Plan of Care  Patient       Patient will benefit from skilled therapeutic intervention in order to improve the following deficits and impairments:  Decreased balance, Dizziness, Difficulty walking  Visit Diagnosis: Unsteadiness on feet  Dizziness and giddiness  Difficulty in walking, not elsewhere classified     Problem List Patient Active Problem List   Diagnosis Date Noted  . Chronic parotitis 11/29/2015  . Parotid sialolithiasis 11/29/2015  . Rhinitis, chronic 11/29/2015  . Arthritis 04/25/2015  . 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 History of biliary T-tube placement 04/25/2015  . Non Hodgkin's lymphoma ([place]) 04/25/2015  . Ocular rosacea 04/25/2015  . Chronic diastolic heart failure ([place]) 04/25/2014  . Essential hypertension 04/25/2014  . Hypertriglyceridemia 04/25/2014  . Positional vertigo   . Sjogren's disease ([place])   . IBS (irritable bowel syndrome)   . Reflux   . Depression   . Abnormal CAT scan 01/17/2013  . Chronic cough 01/17/2013  . 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 Marginal zone lymphoma 07/17/2011  . Abscess of parotid gland 01/30/2011  . Personal history of colonic polyps 04/13/2007    Rico Junker, PT, DPT 08/04/17    1:02 PM    [facility] [address] [place], [place], [address] Phone: [phone]   Fax:  [phone]  Name: Nicole [name] MRN: [id number] Date of Birth: 11/30/1932

## 2017-08-06 ENCOUNTER — Ambulatory Visit: Payer: Medicare Other | Admitting: Physical Therapy

## 2017-08-06 ENCOUNTER — Encounter: Payer: Self-pay | Admitting: Physical Therapy

## 2017-08-06 DIAGNOSIS — R262 Difficulty in walking, not elsewhere classified: Secondary | ICD-10-CM

## 2017-08-06 DIAGNOSIS — R42 Dizziness and giddiness: Secondary | ICD-10-CM

## 2017-08-06 DIAGNOSIS — R2681 Unsteadiness on feet: Secondary | ICD-10-CM | POA: Diagnosis not present

## 2017-08-06 NOTE — Patient Instructions (Addendum)
Gaze Stabilization - Tip Card  1.Target must remain in focus, not blurry, and appear stationary while head is in motion. 2.Perform exercises with small head movements (45 to either side of midline). 3.Increase speed of head motion so long as target is in focus. 4.If you wear eyeglasses, be sure you can see target through lens (therapist will give specific instructions for bifocal / progressive lenses). 5.These exercises may provoke dizziness or nausea. Work through these symptoms. If too dizzy, slow head movement slightly. Rest between each exercise. 6.Exercises demand concentration; avoid distractions. 7.For safety, perform standing exercises close to a counter, wall, corner, or next to someone.  Copyright  VHI. All rights reserved.   Gaze Stabilization - Standing Feet Apart   Feet shoulder width apart, hands holding back of chair, keeping eyes on target on wall 3 feet away, tilt head down slightly and move head side to side for 60 seconds. Repeat while moving head up and down for 60 seconds.  Do 2-3 sessions per day.  Feet Heel-Toe "Tandem"    Arms outstretched to touch walls of hallway, walk a straight line bringing one foot directly in front of the other.  LOOK STRAIGHT AHEAD, NOT AT FLOOR. Repeat for 4-6 laps per session. Do _2___ sessions per day.   Random Direction Head Motion    Perform in hallway with one hand touching wall.  Walking on solid surface, move head and eyes up and down every 3 steps for 4-6 laps; repeat performing head turns to left and right every 3 steps.  Perform walking forwards and then backwards.  4 laps Do __2__ sessions per day.  Can add in diagonal head turns.   WALKING DOWN THE HALLWAY WITH EYES CLOSED - ONE HAND TOUCHING THE WALL.  WALK FORWARDS AND THEN BACKWARDS.  PERFORM 4 LAPS.      *ONLY DO THESE EXERCISES IF YOU HAVE A STRONG SENSE OF SPINNING OR WOOZINESS WITH LYING DOWN*  Once you are back to your baseline wooziness, stop doing  these exercises.  How to Perform the Epley Maneuver The Epley maneuver is an exercise that relieves symptoms of vertigo. Vertigo is the feeling that you or your surroundings are moving when they are not. When you feel vertigo, you may feel like the room is spinning and have trouble walking. Dizziness is a little different than vertigo. When you are dizzy, you may feel unsteady or light-headed. You can do this maneuver at home whenever you have symptoms of vertigo. You can do it up to 3 times a day until your symptoms go away. Even though the Epley maneuver may relieve your vertigo for a few weeks, it is possible that your symptoms will return. This maneuver relieves vertigo, but it does not relieve dizziness. What are the risks? If it is done correctly, the Epley maneuver is considered safe. Sometimes it can lead to dizziness or nausea that goes away after a short time. If you develop other symptoms, such as changes in vision, weakness, or numbness, stop doing the maneuver and call your health care provider. How to perform the Epley maneuver 1. Sit on the edge of a bed or table with your back straight and your legs extended or hanging over the edge of the bed or table. 2. Turn your head halfway toward the affected ear or side. 3. Lie backward quickly with your head turned until you are lying flat on your back. You may want to position a pillow under your shoulders. 4. Hold this position  for 30 seconds. You may experience an attack of vertigo. This is normal. 5. Turn your head to the opposite direction until your unaffected ear is facing the floor. 6. Hold this position for 30 seconds. You may experience an attack of vertigo. This is normal. Hold this position until the vertigo stops. 7. Turn your whole body to the same side as your head. Hold for another 30 seconds. 8. Sit back up. You can repeat this exercise up to 3 times a day. Follow these instructions at home:  After doing the Epley  maneuver, you can return to your normal activities.  Ask your health care provider if there is anything you should do at home to prevent vertigo. He or she may recommend that you: ? Keep your head raised (elevated) with two or more pillows while you sleep. ? Do not sleep on the side of your affected ear. ? Get up slowly from bed. ? Avoid sudden movements during the day. ? Avoid extreme head movement, like looking up or bending over. Contact a health care provider if:  Your vertigo gets worse.  You have other symptoms, including: ? Nausea. ? Vomiting. ? Headache. Get help right away if:  You have vision changes.  You have a severe or worsening headache or neck pain.  You cannot stop vomiting.  You have new numbness or weakness in any part of your body. Summary  Vertigo is the feeling that you or your surroundings are moving when they are not.  The Epley maneuver is an exercise that relieves symptoms of vertigo.  If the Epley maneuver is done correctly, it is considered safe. You can do it up to 3 times a day. This information is not intended to replace advice given to you by your health care provider. Make sure you discuss any questions you have with your health care provider. Document Released: 08/22/2013 Document Revised: 07/07/2016 Document Reviewed: 07/07/2016 Elsevier Interactive Patient Education  2017 [place].     -Perform this exercise ONLY IF YOU FEEL A STRONG SENSE OF SPINNING WHEN lying down -Perform two repetitions, one time a day.  Wait two minutes between repetitions   Habituation - Sit to Side-Lying   Sit on edge of bed. Lie down onto the right side and hold until dizziness stops, plus 20 seconds.  Return to sitting and wait until dizziness stops, plus 20 seconds.  Repeat to the left side. Repeat sequence 5 times per session. Do 2 sessions per day.

## 2017-08-06 NOTE — Therapy (Signed)
[facility] [address] [place], [place], [address] Phone: [phone]   Fax:  [phone]  Physical Therapy Treatment  Patient Details  Name: Nicole [name] MRN: [id number] Date of Birth: 07-Oct-1932 Referring Provider: Harlan Stains, MD   Encounter Date: 08/06/2017  PT End of Session - 08/06/17 1131    Visit Number  9    Number of Visits  17    Date for PT Re-Evaluation  08/24/17    Authorization Type  [employer] Medicare: $40 copay; G code and PN every 10th visit    PT Start Time  0936    PT Stop Time  1018    PT Time Calculation (min)  42 min    Activity Tolerance  Patient tolerated treatment well    Behavior During Therapy  [facility] for tasks assessed/performed       Past Medical History:  Diagnosis Date  . Abscess of parotid gland 01/2011   w/parotid stones  . Cancer ([place]) 1992   NHL--BALT  . Depression   . Heartburn   . IBS (irritable bowel syndrome)   . Non Hodgkin's lymphoma ([place])   . Personal history of colonic polyps 04/13/2007   hyperplastic  . Positional vertigo   . Reflux   . Sjogren's syndrome [facility])     Past Surgical History:  Procedure Laterality Date  . APPENDECTOMY    . BREAST BIOPSY Right 1986   Benign   . CATARACT EXTRACTION  2008 &2010   bilateral  . iredectomy  2008   left  . LUNG REMOVAL, PARTIAL  04/1999   right lung  . OOPHORECTOMY  2000  . SALIVARY GLAND SURGERY  01/2011 & 04/2011  . TONSILLECTOMY AND ADENOIDECTOMY    . VEIN SURGERY  1966    There were no vitals filed for this visit.  Subjective Assessment - 08/06/17 0939    Subjective  Still feeling lightheaded with Brandt-Daroff down to R sidelying.  Felt a little off balance doing exercises down her hallway.    Pertinent History  CHF, IBS, Non-Hodgkin lymphoma s/p R upper and middle lobe pneumonectomy in 2000, L knee pain, tinnitus, h/o BPPV, osteopenia and HTN    Patient Stated Goals  To decrease the severity of dizziness.  "I've lived with this for a long time".    Currently in Pain?  No/denies             Vestibular Assessment - 08/06/17 1001      Positional Testing   Dix-Hallpike  Dix-Hallpike Right;Dix-Hallpike Left    Horizontal Canal Testing  Horizontal Canal Right;Horizontal Canal Left      Dix-Hallpike Right   Dix-Hallpike Right Duration  0    Dix-Hallpike Right Symptoms  No nystagmus      Dix-Hallpike Left   Dix-Hallpike Left Duration  10 seconds    Dix-Hallpike Left Symptoms  Upbeat, left rotatory nystagmus      Horizontal Canal Right   Horizontal Canal Right Duration  none    Horizontal Canal Right Symptoms  Normal      Horizontal Canal Left   Horizontal Canal Left Duration  none    Horizontal Canal Left Symptoms  Normal               Vestibular Treatment/Exercise - 08/06/17 1001      Vestibular Treatment/Exercise   Vestibular Treatment Provided  Canalith Repositioning    Canalith Repositioning  Epley Manuever Left       EPLEY MANUEVER LEFT  Number of Reps   2    Overall Response   Improved Symptoms     RESPONSE DETAILS LEFT  During second treatment with roll to R pt noted to have brief geotropic nystagmus      Reviewed and updated the following exercises for HEP:  Feet Heel-Toe "Tandem"    Arms outstretched to touch walls of hallway, walk a straight line bringing one foot directly in front of the other.  LOOK STRAIGHT AHEAD, NOT AT FLOOR. Repeat for 4-6 laps per session. Do _2___ sessions per day.   Random Direction Head Motion    Perform in hallway with one hand touching wall.  Walking on solid surface, move head and eyes up and down every 3 steps for 4-6 laps; repeat performing head turns to left and right every 3 steps.  Perform walking forwards and then backwards.  4 laps Do __2__ sessions per day.  Can add in diagonal head turns.   WALKING DOWN THE HALLWAY WITH EYES CLOSED - ONE HAND TOUCHING THE WALL.  WALK FORWARDS AND THEN BACKWARDS.   PERFORM 4 LAPS.      *ONLY DO THESE EXERCISES IF YOU HAVE A STRONG SENSE OF SPINNING OR WOOZINESS WITH LYING DOWN*  Once you are back to your baseline wooziness, stop doing these exercises.  How to Perform the Epley Maneuver The Epley maneuver is an exercise that relieves symptoms of vertigo. Vertigo is the feeling that you or your surroundings are moving when they are not. When you feel vertigo, you may feel like the room is spinning and have trouble walking. Dizziness is a little different than vertigo. When you are dizzy, you may feel unsteady or light-headed. You can do this maneuver at home whenever you have symptoms of vertigo. You can do it up to 3 times a day until your symptoms go away. Even though the Epley maneuver may relieve your vertigo for a few weeks, it is possible that your symptoms will return. This maneuver relieves vertigo, but it does not relieve dizziness. What are the risks? If it is done correctly, the Epley maneuver is considered safe. Sometimes it can lead to dizziness or nausea that goes away after a short time. If you develop other symptoms, such as changes in vision, weakness, or numbness, stop doing the maneuver and call your health care provider. How to perform the Epley maneuver 1. Sit on the edge of a bed or table with your back straight and your legs extended or hanging over the edge of the bed or table. 2. Turn your head halfway toward the affected ear or side. 3. Lie backward quickly with your head turned until you are lying flat on your back. You may want to position a pillow under your shoulders. 4. Hold this position for 30 seconds. You may experience an attack of vertigo. This is normal. 5. Turn your head to the opposite direction until your unaffected ear is facing the floor. 6. Hold this position for 30 seconds. You may experience an attack of vertigo. This is normal. Hold this position until the vertigo stops. 7. Turn your whole body to the same side  as your head. Hold for another 30 seconds. 8. Sit back up. You can repeat this exercise up to 3 times a day. Follow these instructions at home:  After doing the Epley maneuver, you can return to your normal activities.  Ask your health care provider if there is anything you should do at home to prevent vertigo. He  or she may recommend that you: ? Keep your head raised (elevated) with two or more pillows while you sleep. ? Do not sleep on the side of your affected ear. ? Get up slowly from bed. ? Avoid sudden movements during the day. ? Avoid extreme head movement, like looking up or bending over. Contact a health care provider if:  Your vertigo gets worse.  You have other symptoms, including: ? Nausea. ? Vomiting. ? Headache. Get help right away if:  You have vision changes.  You have a severe or worsening headache or neck pain.  You cannot stop vomiting.  You have new numbness or weakness in any part of your body. Summary  Vertigo is the feeling that you or your surroundings are moving when they are not.  The Epley maneuver is an exercise that relieves symptoms of vertigo.  If the Epley maneuver is done correctly, it is considered safe. You can do it up to 3 times a day. This information is not intended to replace advice given to you by your health care provider. Make sure you discuss any questions you have with your health care provider. Document Released: 08/22/2013 Document Revised: 07/07/2016 Document Reviewed: 07/07/2016 Elsevier Interactive Patient Education  2017 [place].     -Perform this exercise ONLY IF YOU FEEL A STRONG SENSE OF SPINNING WHEN lying down -Perform two repetitions, one time a day.  Wait two minutes between repetitions   Habituation - Sit to Side-Lying   Sit on edge of bed. Lie down onto the right side and hold until dizziness stops, plus 20 seconds.  Return to sitting and wait until dizziness stops, plus 20 seconds.  Repeat to the  left side. Repeat sequence 5 times per session. Do 2 sessions per day.          PT Education - 08/06/17 1125    Education provided  Yes    Education Details  reviewed and updated HEP    Person(s) Educated  Patient    Methods  Explanation;Demonstration;Handout    Comprehension  Verbalized understanding;Returned demonstration       PT Short Term Goals - 07/16/17 0935      PT SHORT TERM GOAL #1   Title  Pt will participate in further assessment of hypofunction and balance with DVA, gait velocity and FGA    Time  2    Period  Weeks    Status  Achieved      PT SHORT TERM GOAL #2   Title  Pt will demonstrate independence with initial gaze adaptation, habituation and balance HEP    Time  2    Period  Weeks    Status  Achieved      PT SHORT TERM GOAL #3   Title  Pt will tolerate treatment of anterior canal cupulolithiasis and will report vertigo with provocative movements as <4/10    Baseline  multi canal BPPV on 07/16/2017 - treatment initiated    Time  2    Period  Weeks    Status  On-going        PT Long Term Goals - 08/04/17 1254      PT LONG TERM GOAL #1   Title  Pt will demonstrate independence with ongoing HEP    Baseline  11/30: met to date    Status  On-going    Target Date  08/24/17      PT LONG TERM GOAL #2   Title  Pt will report 10 point decrease  in [facility]    Status  Achieved      PT LONG TERM GOAL #3   Title  Pt will demonstrate 2/3 line difference on DVA     Baseline  4 line difference    Status  On-going    Target Date  08/24/17      PT LONG TERM GOAL #4   Title  Pt will demonstrate negative positional testing    Baseline  11/30: continues to be symptomatic; deferred repositioning today    Status  On-going    Target Date  08/24/17      PT LONG TERM GOAL #5   Title  Will increase FGA to > or = 22/30 to decrease falls risk    Status  Achieved      PT LONG TERM GOAL #6   Title  Will improve gait velocity to > or = 3.2 ft/sec to decrease falls  risk during community ambulation    Status  Achieved      PT LONG TERM GOAL #7   Title  Pt will report 0-1 intensity on MSQ for bending, head turns, nods and pivot in standing due to improved motion tolerance    Status  On-going    Target Date  08/24/17            Plan - 08/06/17 1131    Clinical Impression Statement  Initiated review of home exercise program in preparation for D/C next week.   When reviewing home Epley maneuver pt reported significant vertigo and noted to have L-upbeating nystagmus with L Hallpike-Dix inidicating L posterior canal canalithiasis.  Pt treated x 2 and on second treatment pt presented with brief geotropic nystagmus; when horizontal canals assessed - no vertigo or nystagmus noted.  Following treatment for BPPV continued review of standing balance exercises performed in hallway and upgraded gait with eyes closed and gait with head turns and clarified habituation exercises.  Will continue to assess peripheral canals and review HEP next week and plan to D/C second visit next week.    PT Treatment/Interventions  ADLs/Self Care Home Management;Canalith Repostioning;Gait training;Stair training;Functional mobility training;Therapeutic activities;Therapeutic exercise;Balance training;Neuromuscular re-education;Patient/family education;Vestibular    PT Next Visit Plan  10th visit G code and PN; finalize x1 viewing for HEP, re-check all canals and treat for BPPV as needed; D/C after two visits next week    Consulted and Agree with Plan of Care  Patient       Patient will benefit from skilled therapeutic intervention in order to improve the following deficits and impairments:  Decreased balance, Dizziness, Difficulty walking  Visit Diagnosis: Unsteadiness on feet  Difficulty in walking, not elsewhere classified  Dizziness and giddiness     Problem List Patient Active Problem List   Diagnosis Date Noted  . Chronic parotitis 11/29/2015  . Parotid  sialolithiasis 11/29/2015  . Rhinitis, chronic 11/29/2015  . Arthritis 04/25/2015  . 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 History of biliary T-tube placement 04/25/2015  . Non Hodgkin's lymphoma ([place]) 04/25/2015  . Ocular rosacea 04/25/2015  . Chronic diastolic heart failure ([place]) 04/25/2014  . Essential hypertension 04/25/2014  . Hypertriglyceridemia 04/25/2014  . Positional vertigo   . Sjogren's disease ([place])   . IBS (irritable bowel syndrome)   . Reflux   . Depression   . Abnormal CAT scan 01/17/2013  . Chronic cough 01/17/2013  . 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 Marginal zone lymphoma 07/17/2011  . Abscess of parotid gland 01/30/2011  . Personal history of colonic polyps  04/13/2007    Rico Junker, PT, DPT 08/06/17    11:38 AM    [facility] [address] [place], [place], [address] Phone: [phone]   Fax:  [phone]  Name: Nicole [name] MRN: [id number] Date of Birth: 21-May-1933

## 2017-08-11 ENCOUNTER — Encounter: Payer: Self-pay | Admitting: Physical Therapy

## 2017-08-11 ENCOUNTER — Ambulatory Visit: Payer: Medicare Other | Admitting: Physical Therapy

## 2017-08-11 DIAGNOSIS — R262 Difficulty in walking, not elsewhere classified: Secondary | ICD-10-CM

## 2017-08-11 DIAGNOSIS — H8111 Benign paroxysmal vertigo, right ear: Secondary | ICD-10-CM

## 2017-08-11 DIAGNOSIS — R42 Dizziness and giddiness: Secondary | ICD-10-CM

## 2017-08-11 DIAGNOSIS — R2681 Unsteadiness on feet: Secondary | ICD-10-CM

## 2017-08-11 NOTE — Therapy (Signed)
[facility] [address] [place], [place], [address] Phone: [phone]   Fax:  [phone]  Physical Therapy Treatment  Patient Details  Name: Nicole [name] MRN: [id number] Date of Birth: Dec 18, 1932 Referring Provider: Harlan Stains, MD   Encounter Date: 08/11/2017  PT End of Session - 08/11/17 1502    Visit Number  10    Number of Visits  17    Date for PT Re-Evaluation  08/24/17 D/C next visit on 12/14    Authorization Type  [employer] Medicare: $40 copay; G code and PN every 10th visit    PT Start Time  1318    PT Stop Time  1409    PT Time Calculation (min)  51 min    Activity Tolerance  Patient tolerated treatment well    Behavior During Therapy  [facility] for tasks assessed/performed       Past Medical History:  Diagnosis Date  . Abscess of parotid gland 01/2011   w/parotid stones  . Cancer ([place]) 1992   NHL--BALT  . Depression   . Heartburn   . IBS (irritable bowel syndrome)   . Non Hodgkin's lymphoma ([place])   . Personal history of colonic polyps 04/13/2007   hyperplastic  . Positional vertigo   . Reflux   . Sjogren's syndrome [facility])     Past Surgical History:  Procedure Laterality Date  . APPENDECTOMY    . BREAST BIOPSY Right 1986   Benign   . CATARACT EXTRACTION  2008 &2010   bilateral  . iredectomy  2008   left  . LUNG REMOVAL, PARTIAL  04/1999   right lung  . OOPHORECTOMY  2000  . SALIVARY GLAND SURGERY  01/2011 & 04/2011  . TONSILLECTOMY AND ADENOIDECTOMY    . VEIN SURGERY  1966    There were no vitals filed for this visit.  Subjective Assessment - 08/11/17 1322    Subjective  Felt really off balance on Saturday but was better on Sunday and Monday.  Had bone density scan today and while lying down she felt some wooziness.    Pertinent History  CHF, IBS, Non-Hodgkin lymphoma s/p R upper and middle lobe pneumonectomy in 2000, L knee pain, tinnitus, h/o BPPV, osteopenia and HTN    Patient Stated  Goals  To decrease the severity of dizziness. "I've lived with this for a long time".    Currently in Pain?  No/denies         [facility] PT Assessment - 08/11/17 1400      Observation/Other Assessments   Focus on Therapeutic Outcomes (FOTO)   from 86% to 80%    Dizziness Handicap Inventory [facility])   remained at 10         Vestibular Assessment - 08/11/17 1326      Positional Testing   Dix-Hallpike  Dix-Hallpike Right;Dix-Hallpike Left    Horizontal Canal Testing  Horizontal Canal Right;Horizontal Canal Left      Dix-Hallpike Right   Dix-Hallpike Right Duration  >1 minute    Dix-Hallpike Right Symptoms  Downbeat, left rotatory nystagmus indicating L anterior canal cupulolithiasis      Dix-Hallpike Left   Dix-Hallpike Left Duration  0    Dix-Hallpike Left Symptoms  No nystagmus      Horizontal Canal Right   Horizontal Canal Right Duration  0    Horizontal Canal Right Symptoms  Normal      Horizontal Canal Left   Horizontal Canal Left Duration  0  Horizontal Canal Left Symptoms  Normal               Vestibular Treatment/Exercise - 08/11/17 1334      Vestibular Treatment/Exercise   Vestibular Treatment Provided  Canalith Repositioning    Canalith Repositioning  Semont Procedure Left Anterior      Semont Procedure Left Anterior   Number of Reps   2    Overall Response   No change    Response Details   first treatment without vibration with no change in symptoms upon retest; second treatment applied vibration to mastoid bone on L side x 20 seconds before performing repositioning treatment.  Little to no change after vibration with treatment              PT Short Term Goals - 07/16/17 0935      PT SHORT TERM GOAL #1   Title  Pt will participate in further assessment of hypofunction and balance with DVA, gait velocity and FGA    Time  2    Period  Weeks    Status  Achieved      PT SHORT TERM GOAL #2   Title  Pt will demonstrate independence with  initial gaze adaptation, habituation and balance HEP    Time  2    Period  Weeks    Status  Achieved      PT SHORT TERM GOAL #3   Title  Pt will tolerate treatment of anterior canal cupulolithiasis and will report vertigo with provocative movements as <4/10    Baseline  multi canal BPPV on 07/16/2017 - treatment initiated    Time  2    Period  Weeks    Status  On-going        PT Long Term Goals - 08/11/17 1506      PT LONG TERM GOAL #1   Title  Pt will demonstrate independence with ongoing HEP    Baseline  11/30: met to date    Status  On-going    Target Date  08/24/17      PT LONG TERM GOAL #2   Title  Pt will report 10 point decrease in [place]    Baseline  10    Status  Achieved      PT LONG TERM GOAL #3   Title  Pt will demonstrate 2/3 line difference on DVA     Baseline  4 line difference    Status  On-going    Target Date  08/24/17      PT LONG TERM GOAL #4   Title  Pt will demonstrate negative positional testing    Baseline  11/30: continues to be symptomatic; deferred repositioning today    Status  On-going    Target Date  08/24/17      PT LONG TERM GOAL #5   Title  Will increase FGA to > or = 22/30 to decrease falls risk    Status  Achieved      PT LONG TERM GOAL #6   Title  Will improve gait velocity to > or = 3.2 ft/sec to decrease falls risk during community ambulation    Status  Achieved      PT LONG TERM GOAL #7   Title  Pt will report 0-1 intensity on MSQ for bending, head turns, nods and pivot in standing due to improved motion tolerance    Status  On-going    Target Date  08/24/17  Plan - 09-03-17 1503    Clinical Impression Statement  Performed re-assessment of peripheral canals.  Pt was negative for canalithiasis on L side but continues to present with persistent downbeating nystagmus with mild L rotation (also more symptomatic with R hallpike dix which is stimulating R posterior with L anterior canals).  Pt treated x 2 for L  anterior canal cupulolithiasis without and then with vibration with little to no change in symptoms.  Performed re-assessment of overall function with FOTO and [place] with [place] remaining unchanged from 11/30.  Will continue to assess BPPV, finalize x 1 viewing HEP and rest of LTG at next session.  Plan is still to D/C at next session.      Rehab Potential  Good    PT Frequency  2x / week    PT Duration  8 weeks    PT Treatment/Interventions  ADLs/Self Care Home Management;Canalith Repostioning;Gait training;Stair training;Functional mobility training;Therapeutic activities;Therapeutic exercise;Balance training;Neuromuscular re-education;Patient/family education;Vestibular    PT Next Visit Plan   finalize x1 viewing for HEP, re-check all canals and treat for BPPV as needed.  D/C    Consulted and Agree with Plan of Care  Patient       Patient will benefit from skilled therapeutic intervention in order to improve the following deficits and impairments:  Decreased balance, Dizziness, Difficulty walking  Visit Diagnosis: Unsteadiness on feet  Difficulty in walking, not elsewhere classified  Dizziness and giddiness  BPPV (benign paroxysmal positional vertigo), right   G-Codes - 2017-09-03 1507    Functional Assessment Tool Used (Outpatient Only)  positional testing + for anterior canal cupulolithiasis; DHI: 10    Functional Limitation  Mobility: Walking and moving around    Mobility: Walking and Moving Around Current Status [phone])  At least 1 percent but less than 20 percent impaired, limited or restricted    Mobility: Walking and Moving Around Goal Status [id number])  At least 1 percent but less than 20 percent impaired, limited or restricted      Physical Therapy Progress Note  Dates of Reporting Period: 06/26/17 to 03-Sep-2017  Objective Reports of Subjective Statement: See impression statement above  Objective Measurements: positional testing, DHI  Goal Update: See LTG achievement to  date  Plan: Continue POC and plan to D/C early at next visit due to progress  Reason Skilled Services are Required: To continue to address vestibular and balance impairments to decrease falls risk.   Problem List Patient Active Problem List   Diagnosis Date Noted  . Chronic parotitis 11/29/2015  . Parotid sialolithiasis 11/29/2015  . Rhinitis, chronic 11/29/2015  . Arthritis 04/25/2015  . 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 History of biliary T-tube placement 04/25/2015  . Non Hodgkin's lymphoma ([place]) 04/25/2015  . Ocular rosacea 04/25/2015  . Chronic diastolic heart failure ([place]) 04/25/2014  . Essential hypertension 04/25/2014  . Hypertriglyceridemia 04/25/2014  . Positional vertigo   . Sjogren's disease ([place])   . IBS (irritable bowel syndrome)   . Reflux   . Depression   . Abnormal CAT scan 01/17/2013  . Chronic cough 01/17/2013  . 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 Marginal zone lymphoma 07/17/2011  . Abscess of parotid gland 01/30/2011  . Personal history of colonic polyps 04/13/2007    Rico Junker, PT, DPT 09/03/2017    3:11 PM    [facility] [address] [place], [place], [address] Phone: [phone]   Fax:  [phone]  Name: Nicole [name] MRN: [id number] Date of  Birth: Apr 14, 1933

## 2017-08-13 ENCOUNTER — Encounter: Payer: Self-pay | Admitting: Physical Therapy

## 2017-08-13 ENCOUNTER — Ambulatory Visit: Payer: Medicare Other | Admitting: Physical Therapy

## 2017-08-13 DIAGNOSIS — R2681 Unsteadiness on feet: Secondary | ICD-10-CM | POA: Diagnosis not present

## 2017-08-13 DIAGNOSIS — R42 Dizziness and giddiness: Secondary | ICD-10-CM

## 2017-08-13 DIAGNOSIS — H8111 Benign paroxysmal vertigo, right ear: Secondary | ICD-10-CM

## 2017-08-13 DIAGNOSIS — R262 Difficulty in walking, not elsewhere classified: Secondary | ICD-10-CM

## 2017-08-14 DIAGNOSIS — R2681 Unsteadiness on feet: Secondary | ICD-10-CM | POA: Diagnosis not present

## 2017-08-14 NOTE — Therapy (Signed)
[facility] [address] [place], [place], [address] Phone: [phone]   Fax:  [phone]  Physical Therapy Treatment and D/C Summary  Patient Details  Name: Nicole [name] MRN: [id number] Date of Birth: 05-Apr-1933 Referring Provider: Harlan Stains, MD   Encounter Date: 08/13/2017  PT End of Session - 08/14/17 1303    Visit Number  11    Number of Visits  17    Date for PT Re-Evaluation  08/24/17    Authorization Type  [employer] Medicare: $40 copay; G code and PN every 10th visit    PT Start Time  0930    PT Stop Time  1017    PT Time Calculation (min)  47 min    Activity Tolerance  Patient tolerated treatment well    Behavior During Therapy  [facility] for tasks assessed/performed       Past Medical History:  Diagnosis Date  . Abscess of parotid gland 01/2011   w/parotid stones  . 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 Depression   . Heartburn   . IBS (irritable bowel syndrome)   . Non Hodgkin's lymphoma ([place])   . Personal history of colonic polyps 04/13/2007   hyperplastic  . Positional vertigo   . Reflux   . Sjogren's syndrome [facility])     Past Surgical History:  Procedure Laterality Date  . APPENDECTOMY    . BREAST BIOPSY Right 1986   Benign   . CATARACT EXTRACTION  2008 &2010   bilateral  . iredectomy  2008   left  . LUNG REMOVAL, PARTIAL  04/1999   right lung  . OOPHORECTOMY  2000  . SALIVARY GLAND SURGERY  01/2011 & 04/2011  . TONSILLECTOMY AND ADENOIDECTOMY    . VEIN SURGERY  1966    There were no vitals filed for this visit.  Subjective Assessment - 08/13/17 0933    Subjective  Has not had another episode of feeling really off balance; did not feel too off balance after treatment on Wednesday.  Does feel better overall.    Pertinent History  CHF, IBS, Non-Hodgkin lymphoma s/p R upper and middle lobe pneumonectomy in 2000, L knee pain, tinnitus, h/o BPPV, osteopenia and HTN    Patient Stated Goals  To  decrease the severity of dizziness. "I've lived with this for a long time".    Currently in Pain?  No/denies             Vestibular Assessment - 08/13/17 0936      Visual Acuity   Static  7    Dynamic  4 3 line difference      Positional Testing   Dix-Hallpike  Dix-Hallpike Right;Dix-Hallpike Left      Dix-Hallpike Right   Dix-Hallpike Right Duration  1 minute    Dix-Hallpike Right Symptoms  Downbeat Nystagmus      Dix-Hallpike Left   Dix-Hallpike Left Duration  0    Dix-Hallpike Left Symptoms  No nystagmus      Positional Sensitivities   Head Turning x 5  Lightheadedness    Head Nodding x 5  Lightheadedness    Pivot Right in Standing  No dizziness    Pivot Left in Standing  Mild dizziness               Vestibular Treatment/Exercise - 08/13/17 0939      Vestibular Treatment/Exercise   Vestibular Treatment Provided  Gaze    Gaze Exercises  X1 Viewing Horizontal;X1 Viewing Vertical  X1 Viewing Horizontal   Foot Position  feet together on compliant pillow, solid background.  Solid floor, busy background    Reps  4    Comments  60 seconds      X1 Viewing Vertical   Foot Position  feet together on compliant pillow, solid background.  Solid floor, busy background    Reps  4    Comments  60 seconds            PT Education - 08/14/17 1259    Education provided  Yes    Education Details  goal achievement, final HEP, D/C today    Person(s) Educated  Patient    Methods  Explanation    Comprehension  Verbalized understanding       PT Short Term Goals - 07/16/17 0935      PT SHORT TERM GOAL #1   Title  Pt will participate in further assessment of hypofunction and balance with DVA, gait velocity and FGA    Time  2    Period  Weeks    Status  Achieved      PT SHORT TERM GOAL #2   Title  Pt will demonstrate independence with initial gaze adaptation, habituation and balance HEP    Time  2    Period  Weeks    Status  Achieved      PT SHORT  TERM GOAL #3   Title  Pt will tolerate treatment of anterior canal cupulolithiasis and will report vertigo with provocative movements as <4/10    Baseline  multi canal BPPV on 07/16/2017 - treatment initiated    Time  2    Period  Weeks    Status  On-going        PT Long Term Goals - 08/14/17 1306      PT LONG TERM GOAL #1   Title  Pt will demonstrate independence with ongoing HEP    Status  Achieved      PT LONG TERM GOAL #2   Title  Pt will report 10 point decrease in [place]    Baseline  10    Status  Achieved      PT LONG TERM GOAL #3   Title  Pt will demonstrate 2/3 line difference on DVA     Baseline  3 line difference on 12/14    Status  Achieved      PT LONG TERM GOAL #4   Title  Pt will demonstrate negative positional testing    Baseline  continues to present with persistent anterior canal cupulolithiasis despite multiple treatments; posterior canal cleared with pt returning to her baseline    Status  Partially Met      PT LONG TERM GOAL #5   Title  Will increase FGA to > or = 22/30 to decrease falls risk    Status  Achieved      PT LONG TERM GOAL #6   Title  Will improve gait velocity to > or = 3.2 ft/sec to decrease falls risk during community ambulation    Status  Achieved      PT LONG TERM GOAL #7   Title  Pt will report 0-1 intensity on MSQ for bending, head turns, nods and pivot in standing due to improved motion tolerance    Baseline  all 0 to 1 intensity except turning to left in standing remains a 2 in severity    Status  Partially Met  Plan - 2017/09/05 1305    Clinical Impression Statement  Pt has made good progress and has met 5/7 LTG.  Pt has returned to her baseline "dizziness" that she reports she has dealt with for multiple decades. Pt does report a full resolution of spinning she was experiencing with bed mobility - rolling and supine <> sit.  She also demonstrates improvement in use of VOR, dynamic balance and gait with decreased  falls risk.  Pt agreeable to D/C with HEP.  Pt also encouraged to seek out community wellness resources to maintain her gains in balance and endurance training.      Rehab Potential  Good    PT Frequency  2x / week    PT Duration  8 weeks    PT Treatment/Interventions  ADLs/Self Care Home Management;Canalith Repostioning;Gait training;Stair training;Functional mobility training;Therapeutic activities;Therapeutic exercise;Balance training;Neuromuscular re-education;Patient/family education;Vestibular    PT Next Visit Plan  D/C    Consulted and Agree with Plan of Care  Patient       Patient will benefit from skilled therapeutic intervention in order to improve the following deficits and impairments:  Decreased balance, Dizziness, Difficulty walking  Visit Diagnosis: Unsteadiness on feet  Difficulty in walking, not elsewhere classified  Dizziness and giddiness  BPPV (benign paroxysmal positional vertigo), right   G-Codes - 09/05/2017 1320    Functional Assessment Tool Used (Outpatient Only)  positional testing + for anterior canal cupulolithiasis; DHI: 10    Functional Limitation  Mobility: Walking and moving around    Mobility: Walking and Moving Around Goal Status [phone])  At least 1 percent but less than 20 percent impaired, limited or restricted    Mobility: Walking and Moving Around Discharge Status [phone])  At least 1 percent but less than 20 percent impaired, limited or restricted      PHYSICAL THERAPY DISCHARGE SUMMARY  Visits from Start of Care: 11  Current functional level related to goals / functional outcomes: See LTG achievement; met 5/7 LTG   Remaining deficits: Anterior canal cupulolithiasis, mild dizziness, motion sensitivity and vestibular hypofunction   Education / Equipment: HEP  Plan: Patient agrees to discharge.  Patient goals were partially met. Patient is being discharged due to being pleased with the current functional level.  ?????       Problem  List Patient Active Problem List   Diagnosis Date Noted  . Chronic parotitis 11/29/2015  . Parotid sialolithiasis 11/29/2015  . Rhinitis, chronic 11/29/2015  . Arthritis 04/25/2015  . 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 History of biliary T-tube placement 04/25/2015  . Non Hodgkin's lymphoma ([place]) 04/25/2015  . Ocular rosacea 04/25/2015  . Chronic diastolic heart failure ([place]) 04/25/2014  . Essential hypertension 04/25/2014  . Hypertriglyceridemia 04/25/2014  . Positional vertigo   . Sjogren's disease ([place])   . IBS (irritable bowel syndrome)   . Reflux   . Depression   . Abnormal CAT scan 01/17/2013  . Chronic cough 01/17/2013  . 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 Marginal zone lymphoma 07/17/2011  . Abscess of parotid gland 01/30/2011  . Personal history of colonic polyps 04/13/2007    Rico Junker, PT, DPT 05-Sep-2017    1:23 PM    [facility] [address] [place], [place], [address] Phone: [phone]   Fax:  [phone]  Name: Nicole [name] MRN: [id number] Date of Birth: [birth year]

## 2017-10-27 ENCOUNTER — Emergency Department (HOSPITAL_COMMUNITY)
Admission: EM | Admit: 2017-10-27 | Discharge: 2017-10-28 | Disposition: A | Discharge status: Home / Self Care | Payer: Medicare Other | Attending: Emergency Medicine | Admitting: Emergency Medicine

## 2017-10-27 ENCOUNTER — Emergency Department (HOSPITAL_COMMUNITY): Payer: Medicare Other

## 2017-10-27 DIAGNOSIS — I5032 Chronic diastolic (congestive) heart failure: Secondary | ICD-10-CM | POA: Insufficient documentation

## 2017-10-27 DIAGNOSIS — R1084 Generalized abdominal pain: Secondary | ICD-10-CM | POA: Diagnosis not present

## 2017-10-27 DIAGNOSIS — Z7982 Long term (current) use of aspirin: Secondary | ICD-10-CM | POA: Diagnosis not present

## 2017-10-27 DIAGNOSIS — I110 Hypertensive heart disease with heart failure: Secondary | ICD-10-CM | POA: Insufficient documentation

## 2017-10-27 DIAGNOSIS — Z79899 Other long term (current) drug therapy: Secondary | ICD-10-CM | POA: Diagnosis not present

## 2017-10-27 DIAGNOSIS — R1030 Lower abdominal pain, unspecified: Secondary | ICD-10-CM | POA: Diagnosis present

## 2017-10-27 DIAGNOSIS — C8590 Non-Hodgkin lymphoma, unspecified, unspecified site: Secondary | ICD-10-CM | POA: Insufficient documentation

## 2017-10-27 DIAGNOSIS — K529 Noninfective gastroenteritis and colitis, unspecified: Secondary | ICD-10-CM

## 2017-10-27 DIAGNOSIS — Z87891 Personal history of nicotine dependence: Secondary | ICD-10-CM | POA: Diagnosis not present

## 2017-10-27 LAB — COMPREHENSIVE METABOLIC PANEL
ALT: 23 U/L (ref 14–54)
AST: 32 U/L (ref 15–41)
Albumin: 3.8 g/dL (ref 3.5–5.0)
Alkaline Phosphatase: 59 U/L (ref 38–126)
Anion gap: 13 (ref 5–15)
BUN: 23 mg/dL — ABNORMAL HIGH (ref 6–20)
CO2: 26 mmol/L (ref 22–32)
Calcium: 9.5 mg/dL (ref 8.9–10.3)
Chloride: 102 mmol/L (ref 101–111)
Creatinine, Ser: 0.97 mg/dL (ref 0.44–1.00)
GFR calc Af Amer: >60 mL/min (ref >60)
GFR calc non Af Amer: 53 mL/min — ABNORMAL LOW (ref >60)
Glucose, Bld: 142 mg/dL — ABNORMAL HIGH (ref 65–99)
Potassium: 4.4 mmol/L (ref 3.5–5.1)
Sodium: 141 mmol/L (ref 135–145)
Total Bilirubin: 0.5 mg/dL (ref 0.3–1.2)
Total Protein: 7.4 g/dL (ref 6.5–8.1)

## 2017-10-27 LAB — URINALYSIS, ROUTINE W REFLEX MICROSCOPIC
Bilirubin Urine: NEGATIVE
Glucose, UA: NEGATIVE mg/dL
Hgb urine dipstick: NEGATIVE
Ketones, ur: 20 mg/dL — AB
Nitrite: NEGATIVE
Protein, ur: 30 mg/dL — AB
Specific Gravity, Urine: 1.025 (ref 1.005–1.030)
pH: 5 (ref 5.0–8.0)

## 2017-10-27 LAB — CBC WITH DIFFERENTIAL/PLATELET
Basophils Absolute: 0 10*3/uL (ref 0.0–0.1)
Basophils Relative: 0 %
Eosinophils Absolute: 0 10*3/uL (ref 0.0–0.7)
Eosinophils Relative: 0 %
HCT: 41.1 % (ref 36.0–46.0)
Hemoglobin: 14.4 g/dL (ref 12.0–15.0)
Lymphocytes Relative: 8 %
Lymphs Abs: 1.8 10*3/uL (ref 0.7–4.0)
MCH: 30.5 pg (ref 26.0–34.0)
MCHC: 35 g/dL (ref 30.0–36.0)
MCV: 87.1 fL (ref 78.0–100.0)
Monocytes Absolute: 0.8 10*3/uL (ref 0.1–1.0)
Monocytes Relative: 4 %
Neutro Abs: 20 10*3/uL — ABNORMAL HIGH (ref 1.7–7.7)
Neutrophils Relative %: 88 %
Platelets: 342 10*3/uL (ref 150–400)
RBC: 4.72 MIL/uL (ref 3.87–5.11)
RDW: 12.2 % (ref 11.5–15.5)
WBC: 22.6 10*3/uL — ABNORMAL HIGH (ref 4.0–10.5)

## 2017-10-27 LAB — LIPASE, BLOOD: Lipase: 26 U/L (ref 11–51)

## 2017-10-27 MED ORDER — ONDANSETRON HCL 4 MG/2ML IJ SOLN
4.0000 mg | Freq: Once | INTRAMUSCULAR | Status: AC
  Administered 2017-10-27: 4 mg via INTRAVENOUS
  Filled 2017-10-27: qty 2

## 2017-10-27 MED ORDER — DICYCLOMINE HCL 10 MG/ML IM SOLN
20.0000 mg | Freq: Once | INTRAMUSCULAR | Status: AC
  Administered 2017-10-27: 20 mg via INTRAMUSCULAR
  Filled 2017-10-27: qty 2

## 2017-10-27 MED ORDER — IOPAMIDOL (ISOVUE-300) INJECTION 61%
INTRAVENOUS | Status: AC
  Administered 2017-10-27: 100 mL
  Filled 2017-10-27: qty 100

## 2017-10-27 MED ORDER — SODIUM CHLORIDE 0.9 % IV BOLUS (SEPSIS)
1000.0000 mL | Freq: Once | INTRAVENOUS | Status: AC
  Administered 2017-10-27: 1000 mL via INTRAVENOUS

## 2017-10-27 MED ORDER — SODIUM CHLORIDE 0.9 % IJ SOLN
INTRAMUSCULAR | Status: AC
  Filled 2017-10-27: qty 50

## 2017-10-27 NOTE — ED Notes (Signed)
Bed: NL27 Expected date:  Expected time:  Means of arrival:  Comments: 84 yo F  N/V/D

## 2017-10-27 NOTE — ED Provider Notes (Signed)
[facility] DEPT Provider Note   CSN: [id number] Arrival date & time: 10/27/17  1950     History   Chief Complaint Chief Complaint  Patient presents with  . Abdominal Pain    HPI Nicole [name] is a 84 y.o. female.  HPI   Cramping severe pain across the lower abdomen, started around 10AM, initially trying to have BM but could not, felt like needed to go but didn't initially, pain greater than 10/10 initially, now 4/10, when having to go to the bathroom more Started having stool at home just prior to EMS coming, light brown, soft with yellow, has been to the bathroom 3 times-but has only gone twice.  No black or bloody stools. Not diarrhea. Having nausea, vomiting after breakfast.  Has vomited 8-10 times No urinary symptoms Chills, no known fevers   Past Medical History:  Diagnosis Date  . Abscess of parotid gland 01/2011   w/parotid stones  . 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 Depression   . Heartburn   . IBS (irritable bowel syndrome)   . Non Hodgkin's lymphoma ([place])   . Personal history of colonic polyps 04/13/2007   hyperplastic  . Positional vertigo   . Reflux   . Sjogren's syndrome [facility])     Patient Active Problem List   Diagnosis Date Noted  . Chronic parotitis 11/29/2015  . Parotid sialolithiasis 11/29/2015  . Rhinitis, chronic 11/29/2015  . Arthritis 04/25/2015  . 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 History of biliary T-tube placement 04/25/2015  . Non Hodgkin's lymphoma ([place]) 04/25/2015  . Ocular rosacea 04/25/2015  . Chronic diastolic heart failure ([place]) 04/25/2014  . Essential hypertension 04/25/2014  . Hypertriglyceridemia 04/25/2014  . Positional vertigo   . Sjogren's disease ([place])   . IBS (irritable bowel syndrome)   . Reflux   . Depression   . Abnormal CAT scan 01/17/2013  . Chronic cough 01/17/2013  . 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 Marginal zone lymphoma 07/17/2011  . Abscess of parotid gland 01/30/2011    . Personal history of colonic polyps 04/13/2007    Past Surgical History:  Procedure Laterality Date  . APPENDECTOMY    . BREAST BIOPSY Right 1986   Benign   . CATARACT EXTRACTION  2008 &2010   bilateral  . iredectomy  2008   left  . LUNG REMOVAL, PARTIAL  04/1999   right lung  . OOPHORECTOMY  2000  . SALIVARY GLAND SURGERY  01/2011 & 04/2011  . TONSILLECTOMY AND ADENOIDECTOMY    . VEIN SURGERY  1966    OB History    No data available       Home Medications    Prior to Admission medications   Medication Sig Start Date End Date Taking? Authorizing Provider  aspirin 81 MG tablet Take 81 mg by mouth daily.     Yes [provider]  Biotin 1000 MCG tablet Take 1,000 mcg by mouth daily.   Yes [provider]  Calcium Carbonate-Vitamin D (CALCIUM-VITAMIN D) 600-200 MG-UNIT CAPS Take 1 tablet by mouth daily.    Yes [provider]  Cholecalciferol (VITAMIN D3) 1000 UNITS CAPS Take 1,000 Units by mouth daily.     Yes [provider]  Flaxseed, Linseed, (FLAX SEED OIL) 1000 MG CAPS Take 1,000 mg by mouth daily.     Yes [provider]  Glucosamine 750 MG TABS Take 750 mg by mouth 2 (two) times daily.    Yes [provider]  hydrochlorothiazide (MICROZIDE) 12.5 MG capsule Take 1 capsule (12.5 mg total) by mouth daily. 07/30/17 07/25/18 Yes Jerline Pain, MD  Methylsulfonylmethane (MSM) 1000 MG CAPS Take 1,000 mg by mouth daily.     Yes [provider]  metoprolol succinate (TOPROL-XL) 25 MG 24 hr tablet Take 1 tablet (25 mg total) by mouth daily. 07/30/17  Yes Jerline Pain, MD  Multiple Vitamin (MULTIVITAMIN) capsule Take 1 capsule by mouth daily.     Yes [provider]  OMEGA 3 1000 MG CAPS Take 1,000 mg by mouth 2 (two) times daily.     Yes [provider]  Polyethyl Glycol-Propyl Glycol (SYSTANE OP) Apply 1 drop to eye every 4 (four) hours as needed (dry eyes). Both eyes   Yes [provider]   PREMARIN 0.45 MG tablet Take 1 tablet by mouth daily. 07/07/16  Yes [provider]  vitamin C (ASCORBIC ACID) 500 MG tablet Take 500 mg by mouth daily.     Yes [provider]  ciprofloxacin (CIPRO) 500 MG tablet Take 1 tablet (500 mg total) by mouth every 12 (twelve) hours for 10 days. 10/28/17 11/07/17  Gareth Morgan, MD  dicyclomine (BENTYL) 20 MG tablet Take 1 tablet (20 mg total) by mouth 2 (two) times daily as needed for spasms (abdominal pain). 10/28/17   Gareth Morgan, MD  metroNIDAZOLE (FLAGYL) 500 MG tablet Take 1 tablet (500 mg total) by mouth 2 (two) times daily for 10 days. 10/28/17 11/07/17  Gareth Morgan, MD  ondansetron (ZOFRAN ODT) 4 MG disintegrating tablet Take 1 tablet (4 mg total) by mouth every 8 (eight) hours as needed for nausea or vomiting. 10/28/17   Gareth Morgan, MD    Family History Family History  Problem Relation Age of Onset  . Celiac disease Mother   . Heart disease Mother   . Heart disease Father   . Heart failure Father   . Kidney cancer Daughter     Social History Social History   Tobacco Use  . Smoking status: Former Research scientist (life sciences)  . Smokeless tobacco: Never Used  Substance Use Topics  . Alcohol use: No  . Drug use: No     Allergies   Ace inhibitors and Protonix [pantoprazole sodium]   Review of Systems Review of Systems  Constitutional: Positive for appetite change and chills. Negative for fever.  Respiratory: Negative for shortness of breath.   Cardiovascular: Negative for chest pain.  Gastrointestinal: Positive for abdominal pain, diarrhea (loose stool), nausea and vomiting.  Genitourinary: Negative for difficulty urinating and dysuria.  Musculoskeletal: Negative for back pain.  Skin: Negative for rash.  Neurological: Negative for light-headedness (has hx of vertigo, denies new lightheadedness).     Physical Exam Updated Vital Signs BP (!) 155/54 (BP Location: Left Arm)   Pulse 95   Temp 98.1 F (36.7 C)  (Oral)   Resp 16   LMP  (LMP Unknown)   SpO2 97%   Physical Exam  Constitutional: She is oriented to person, place, and time. She appears well-developed and well-nourished. No distress.  HENT:  Head: Normocephalic and atraumatic.  Eyes: Conjunctivae and EOM are normal.  Neck: Normal range of motion.  Cardiovascular: Normal rate, regular rhythm, normal heart sounds and intact distal pulses. Exam reveals no gallop and no friction rub.  No murmur heard. Pulmonary/Chest: Effort normal and breath sounds normal. No respiratory distress. She has no wheezes. She has no rales.  Abdominal: Soft. She exhibits no distension. There is tenderness in the right  lower quadrant and suprapubic area. There is no guarding.  Musculoskeletal: She exhibits no edema or tenderness.  Neurological: She is alert and oriented to person, place, and time.  Skin: Skin is warm and dry. No rash noted. She is not diaphoretic. No erythema.  Nursing note and vitals reviewed.    ED Treatments / Results  Labs (all labs ordered are listed, but only abnormal results are displayed) Labs Reviewed  URINALYSIS, ROUTINE W REFLEX MICROSCOPIC - Abnormal; Notable for the following components:      Result Value   Color, Urine AMBER (*)    APPearance HAZY (*)    Ketones, ur 20 (*)    Protein, ur 30 (*)    Leukocytes, UA TRACE (*)    Bacteria, UA RARE (*)    Squamous Epithelial / LPF 6-30 (*)    All other components within normal limits  CBC WITH DIFFERENTIAL/PLATELET - Abnormal; Notable for the following components:   WBC 22.6 (*)    Neutro Abs 20.0 (*)    All other components within normal limits  COMPREHENSIVE METABOLIC PANEL - Abnormal; Notable for the following components:   Glucose, Bld 142 (*)    BUN 23 (*)    GFR calc non Af Amer 53 (*)    All other components within normal limits  URINE CULTURE  LIPASE, BLOOD    EKG  EKG Interpretation None       Radiology Ct Abdomen Pelvis W Contrast  Result Date:  10/27/2017 CLINICAL DATA:  Abdomen pain with distention EXAM: CT ABDOMEN AND PELVIS WITH CONTRAST TECHNIQUE: Multidetector CT imaging of the abdomen and pelvis was performed using the standard protocol following bolus administration of intravenous contrast. CONTRAST:  118mL ISOVUE-300 IOPAMIDOL (ISOVUE-300) INJECTION 61% COMPARISON:  PET-CT 03/11/2011 FINDINGS: Lower chest: Lung bases demonstrate no acute consolidation pleural effusion. Small hiatal hernia. Normal heart size. Hepatobiliary: Small cysts in the liver. No calcified gallstones or biliary dilatation. Pancreas: Unremarkable. No pancreatic ductal dilatation or surrounding inflammatory changes. Spleen: Normal in size without focal abnormality. Adrenals/Urinary Tract: Adrenal glands are unremarkable. Kidneys are normal, without renal calculi, focal lesion, or hydronephrosis. Bladder is unremarkable. Stomach/Bowel: Stomach is nonenlarged. No dilated small bowel. The appendix is not well seen consistent with history of appendectomy. Mild colon wall thickening involving the distal descending and rectosigmoid colon with indistinct appearance of the colon wall. There are scattered diverticula present. Vascular/Lymphatic: Moderate aortic atherosclerosis. No aneurysmal dilatation. No significantly enlarged lymph nodes. Reproductive: Status post hysterectomy. No adnexal masses. Other: Negative for free air or free fluid. Musculoskeletal: No acute or suspicious lesion IMPRESSION: 1. Mild wall thickening involving the distal descending and rectosigmoid colon with mild edema in the adjacent fat. Findings are suggestive of a distal colitis as may be seen with infection, inflammatory or ischemic bowel disease. There are scattered diverticula within the sigmoid colon, however given length of involvement and involvement of segments of bowel that do not have diverticula, favor that this represents colitis over diverticulitis. There is no evidence for perforation, abscess,  or upstream obstruction 2. Small hiatal hernia 3. Small liver cysts Electronically Signed   By: Donavan Foil M.D.   On: 10/27/2017 22:54    Procedures Procedures (including critical care time)  Medications Ordered in ED Medications  sodium chloride 0.9 % injection (not administered)  sodium chloride 0.9 % bolus 1,000 mL (0 mLs Intravenous Stopped 10/27/17 2311)  ondansetron (ZOFRAN) injection 4 mg (4 mg Intravenous Given 10/27/17 2133)  dicyclomine (BENTYL) injection 20 mg (20  mg Intramuscular Given 10/27/17 2133)  iopamidol (ISOVUE-300) 61 % injection (100 mLs  Contrast Given 10/27/17 2228)     Initial Impression / Assessment and Plan / ED Course  I have reviewed the triage vital signs and the nursing notes.  Pertinent labs & imaging results that were available during my care of the patient were reviewed by me and considered in my medical decision making (see chart for details).     83 year old female with history of non-Hodgkin's,, hypertension, Sjogren's syndrome presents with concern for nausea, vomiting, abdominal pain and frequent stools.  Labs are significant for a white count of 22,000, Cr WNL, no sign of pancreatitis.  Urinalysis shows no sign of infection.  CT abdomen pelvis shows colitis of the distal colon, with evidence of diverticula, with radiology favoring colitis over diverticulitis.  Given patient without significant vascular risk factors, with leukocytosis, suspect most likely etiology of her colitis is infectious, and have low suspicion for ischemic colitis.  She reports improvement with Zofran and Bentyl in the emergency department.  She is tolerating p.o., well-appearing, with normal vital signs.  Feel she is appropriate for outpatient management of her colitis and possible diverticulitis.  She is given a prescription for Flagyl and Cipro, Zofran, and Bentyl, and discussed reasons to return in detail. Patient discharged in stable condition with understanding of reasons to  return.   Final Clinical Impressions(s) / ED Diagnoses   Final diagnoses:  Generalized abdominal pain  Colitis    ED Discharge Orders        Ordered    metroNIDAZOLE (FLAGYL) 500 MG tablet  2 times daily     10/28/17 0035    ciprofloxacin (CIPRO) 500 MG tablet  Every 12 hours     10/28/17 0035    ondansetron (ZOFRAN ODT) 4 MG disintegrating tablet  Every 8 hours PRN     10/28/17 0035    dicyclomine (BENTYL) 20 MG tablet  2 times daily PRN     10/28/17 0035       Gareth Morgan, MD 10/28/17 0207

## 2017-10-27 NOTE — ED Triage Notes (Signed)
Pt BIB [facility] from home. Pt is c/o abdominal pain that started 10 am, pain is intermittent and comes on with exertion.  N/V and constipation, unable to hold down food. Pt is ambulatory upon arrival. Pt has hx of IBS however N/V is not normal with pts IBS.

## 2017-10-28 MED ORDER — ONDANSETRON 4 MG PO TBDP: 4.0000 mg | ORAL_TABLET | Freq: Three times a day (TID) | ORAL | 0 refills | Status: AC | PRN

## 2017-10-28 MED ORDER — METRONIDAZOLE 500 MG PO TABS: 500.0000 mg | ORAL_TABLET | Freq: Two times a day (BID) | ORAL | 0 refills | Status: AC

## 2017-10-28 MED ORDER — DICYCLOMINE HCL 20 MG PO TABS: 20.0000 mg | ORAL_TABLET | Freq: Two times a day (BID) | ORAL | 0 refills | Status: DC | PRN

## 2017-10-28 MED ORDER — CIPROFLOXACIN HCL 500 MG PO TABS: 500.0000 mg | ORAL_TABLET | Freq: Two times a day (BID) | ORAL | 0 refills | Status: AC

## 2017-10-29 LAB — URINE CULTURE: Culture: NO GROWTH

## 2017-11-22 ENCOUNTER — Other Ambulatory Visit: Payer: Self-pay | Admitting: Family Medicine

## 2017-11-22 DIAGNOSIS — Z1231 Encounter for screening mammogram for malignant neoplasm of breast: Secondary | ICD-10-CM

## 2017-12-10 ENCOUNTER — Ambulatory Visit
Admission: RE | Admit: 2017-12-10 | Discharge: 2017-12-10 | Disposition: A | Discharge status: Home / Self Care | Payer: Medicare Other | Source: Ambulatory Visit | Attending: Family Medicine | Admitting: Family Medicine

## 2017-12-10 DIAGNOSIS — Z1231 Encounter for screening mammogram for malignant neoplasm of breast: Secondary | ICD-10-CM

## 2018-07-11 ENCOUNTER — Encounter: Payer: Self-pay | Admitting: Cardiology

## 2018-08-01 ENCOUNTER — Ambulatory Visit: Payer: Medicare Other | Admitting: Cardiology

## 2018-08-01 ENCOUNTER — Encounter: Payer: Self-pay | Admitting: Cardiology

## 2018-08-01 VITALS — BP 140/70 | HR 66 | Ht 63.0 in | Wt 152.8 lb

## 2018-08-01 DIAGNOSIS — G250 Essential tremor: Secondary | ICD-10-CM

## 2018-08-01 DIAGNOSIS — E781 Pure hyperglyceridemia: Secondary | ICD-10-CM | POA: Diagnosis not present

## 2018-08-01 DIAGNOSIS — I5032 Chronic diastolic (congestive) heart failure: Secondary | ICD-10-CM | POA: Diagnosis not present

## 2018-08-01 DIAGNOSIS — I10 Essential (primary) hypertension: Secondary | ICD-10-CM

## 2018-08-01 MED ORDER — HYDROCHLOROTHIAZIDE 12.5 MG PO CAPS: 12.5000 mg | ORAL_CAPSULE | Freq: Every day | ORAL | 3 refills | Status: DC

## 2018-08-01 MED ORDER — PROPRANOLOL HCL ER 60 MG PO CP24: 60.0000 mg | ORAL_CAPSULE | Freq: Every day | ORAL | 3 refills | Status: DC

## 2018-08-01 NOTE — Progress Notes (Signed)
[facility]. [address], [address] [place], [place]  [address] Phone: [phone] Fax:  [phone]  Date:  08/02/2018   ID:  Nicole, [name] 1933-04-16, MRN [id number]  PCP:  Harlan Stains, MD   History of Present Illness: Nicole [name] is a 84 y.o. female with hypertriglyceridemia, diastolic dysfunction, parotid gland disease. Diastolic dysfunction-on low-dose metoprolol. Prior uphill SOB has increased, moderate in severity, relieved with rest, no associated symptoms of chest discomfort or heaviness. CT of chest showed 2 small areas of concern. Dr. Arlyce Dice performed surgery.   She has had hypertriglyceridemia. This is improved significantly with fish oil. Down into the 150-190 range.  Traveling often. [place] trips. She stays in youth hostils. Enjoying time with her friend Nicole [name].  [place] travel.   Lymphoma. Eye. 5 chemo tx. Rituxan. [facility].  Last 10/2014  Noted SOB with inclines (declined since knee pain has limited walking). No CP. No real change in symptoms. Parotid gland issues have resolved.  Her blood pressure was slightly elevated at home. She showed me home values. We decided to add back on HCTZ 12.5 mg.  07/30/17 - brother CABG 54.  Overall she is doing very well.  No chest pain, no significant change in shortness of breath, only when exerting herself significantly.  She had to curtail her travels recently, her significant other's dog has been sick.  She is going to a destination wedding in [place] soon.  Denies any syncope, bleeding, orthopnea, PND.  08/01/18 - [place] travel. [place] wedding. [place].  Doing well.  She does have an essential tremor.  She would like to transition from metoprolol over to propranolol.  This is fine.  No chest pain fevers chills nausea vomiting shortness of breath   Wt Readings from Last 3 Encounters:  08/01/18 152 lb 12.8 oz (69.3 kg)  07/30/17 152 lb (68.9 kg)  07/29/16 151 lb 3.2 oz (68.6 kg)     Past  Medical History:  Diagnosis Date  . Abscess of parotid gland 01/2011   w/parotid stones  . 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 Depression   . Heartburn   . IBS (irritable bowel syndrome)   . Non Hodgkin's lymphoma ([place])   . Personal history of colonic polyps 04/13/2007   hyperplastic  . Positional vertigo   . Reflux   . Sjogren's syndrome [facility])     Past Surgical History:  Procedure Laterality Date  . APPENDECTOMY    . BREAST BIOPSY Right 1986   Benign   . CATARACT EXTRACTION  2008 &2010   bilateral  . iredectomy  2008   left  . LUNG REMOVAL, PARTIAL  04/1999   right lung  . OOPHORECTOMY  2000  . SALIVARY GLAND SURGERY  01/2011 & 04/2011  . TONSILLECTOMY AND ADENOIDECTOMY    . VEIN SURGERY  1966    Current Outpatient Medications  Medication Sig Dispense Refill  . aspirin 81 MG tablet Take 81 mg by mouth daily.      . Biotin 1000 MCG tablet Take 1,000 mcg by mouth daily.    . Calcium Carbonate-Vitamin D (CALCIUM-VITAMIN D) 600-200 MG-UNIT CAPS Take 1 tablet by mouth daily.     . Cholecalciferol (VITAMIN D3) 1000 UNITS CAPS Take 1,000 Units by mouth daily.      Marland Kitchen dicyclomine (BENTYL) 20 MG tablet Take 1 tablet (20 mg total) by mouth 2 (two) times daily as needed for spasms (abdominal pain). 20 tablet 0  .  famotidine (PEPCID) 40 MG tablet Take 40 mg by mouth daily.  1  . Flaxseed, Linseed, (FLAX SEED OIL) 1000 MG CAPS Take 1,000 mg by mouth daily.      . Glucosamine 750 MG TABS Take 750 mg by mouth 2 (two) times daily.     . hydrochlorothiazide (MICROZIDE) 12.5 MG capsule Take 1 capsule (12.5 mg total) by mouth daily. 90 capsule 3  . Methylsulfonylmethane (MSM) 1000 MG CAPS Take 1,000 mg by mouth daily.      . Multiple Vitamin (MULTIVITAMIN) capsule Take 1 capsule by mouth daily.      . OMEGA 3 1000 MG CAPS Take 1,000 mg by mouth 2 (two) times daily.      . ondansetron (ZOFRAN ODT) 4 MG disintegrating tablet Take 1 tablet (4 mg total) by mouth every 8 (eight) hours as needed for  nausea or vomiting. 20 tablet 0  . Polyethyl Glycol-Propyl Glycol (SYSTANE OP) Apply 1 drop to eye every 4 (four) hours as needed (dry eyes). Both eyes    . PREMARIN 0.45 MG tablet Take 1 tablet by mouth daily.    . vitamin C (ASCORBIC ACID) 500 MG tablet Take 500 mg by mouth daily.      . propranolol ER (INDERAL LA) 60 MG 24 hr capsule Take 1 capsule (60 mg total) by mouth daily. 90 capsule 3   No current facility-administered medications for this visit.     Allergies:    Allergies  Allergen Reactions  . Ace Inhibitors     cough  . Protonix [Pantoprazole Sodium]     Dizziness    Social History:  The patient  reports that she has quit smoking. She has never used smokeless tobacco. She reports that she does not drink alcohol or use drugs.   Family History  Problem Relation Age of Onset  . Celiac disease Mother   . Heart disease Mother   . Heart disease Father   . Heart failure Father   . Kidney cancer Daughter     ROS:  Please see the history of present illness.  Her tinnitus has been chronic for years.  Denies any chest pain fevers chills nausea vomiting syncope all other review of systems negative.  PHYSICAL EXAM: VS:  BP 140/70   Pulse 66   Ht 5\' 3"  (1.6 m)   Wt 152 lb 12.8 oz (69.3 kg)   LMP  (LMP Unknown)   BMI 27.07 kg/m  GEN: Well nourished, well developed, in no acute distress  HEENT: normal  Neck: no JVD, carotid bruits, or masses Cardiac: RRR; 2/6 SM, no rubs, or gallops,no edema  Respiratory:  clear to auscultation bilaterally, normal work of breathing GI: soft, nontender, nondistended, + BS MS: no deformity or atrophy  Skin: warm and dry, no rash Neuro:  Alert and Oriented x 3, Strength and sensation are intact Psych: euthymic mood, full affect    EKG:  Today- 08/01/2018-sinus rhythm 66 low voltage inferior infarct pattern.  No significant change from prior.  07/30/17-sinus rhythm 72, small inferior Q waves in 3 and aVF, overall borderline low voltage.   Personally viewed no significant changes.  07/29/16-sinus rhythm, 67, no other abnormalities. Personally viewed. 04/25/15-sinus rhythm, 63, low voltage, poor R-wave progression, personally viewed-04/25/14-sinus rhythm, no other abnormalities, baseline artifact.  ECHO: 07/21/12 - normal ejection fraction, calcified aortic valve (question murmur), trace MR, diastolic dysfunction grade 1  NUC: 07/21/12 - low risk, no ischemia, normal EF.  ASSESSMENT AND PLAN:  1. Chronic diastolic  heart failure-symptoms of shortness of breath has been chronic but improved. Notices when in a big hurry.  Fairly well controlled. Echocardiogram reassuring. Multifactorial also from deconditioning. Continue with metoprolol. Continue to monitor. Previous nuclear stress test reassuring.  No changes, continue with current therapy.  Echocardiogram reassuring. CT scan of chest reassuring with no parenchymal lung disease. No pericardial effusion.  Doing very well.  No changes.  Medications reviewed. 2. Hypertriglyceridemia-previous excellent response to fish oil.  Continue with diet as well.  Triglycerides most recently 208 3. Hypertension-HCTZ 12.5 mg.   4. Essential tremor  - changing from metoprolol over to propranolol for essential tremor.  60 mg extended release once a day. Pulse is been in the 50s to 60s in the past, currently 70s.  As for any bradycardia 5. Lymphoma - ended therapy in 10/2014 - [facility]. Doing well. Parotid Dr. Candis Schatz.  Thankfully, she continues to be in remission.  Doing well 6. Knee pain - Sees Dr. Latanya Maudlin.  Doing fine 7. Basal cell carcinoma of skin-sees Dr. Sarajane Jews of dermatology.  No changes 8. One-year followup.  Signed, Candee Furbish, MD [facility]  08/02/2018 6:51 AM

## 2018-08-01 NOTE — Patient Instructions (Signed)
Medication Instructions:  Please discontinue your Metoprolol and start Propranolol 60 mg daily. Continue all other medications as listed.  If you need a refill on your cardiac medications before your next appointment, please call your pharmacy.   Follow-Up: At [facility], you and your health needs are our priority.  As part of our continuing mission to provide you with exceptional heart care, we have created designated Provider Care Teams.  These Care Teams include your primary Cardiologist (physician) and Advanced Practice Providers (APPs -  Physician Assistants and Nurse Practitioners) who all work together to provide you with the care you need, when you need it. You will need a follow up appointment in 12 months.  Please call our office 2 months in advance to schedule this appointment.  You may see Candee Furbish, MD or one of the following Advanced Practice Providers on your designated Care Team:   Truitt Merle, NP Cecilie Kicks, NP . Kathyrn Drown, NP  Thank you for choosing [facility]!!

## 2018-10-26 ENCOUNTER — Telehealth: Payer: Self-pay | Admitting: Cardiology

## 2018-10-26 NOTE — Telephone Encounter (Signed)
Spoke with patient who is reporting since she changed from Metoprolol to Propranolol she is having more and more trouble with her sense of balance, as if she can not walk a straight line.  She reports the propranolol has helped her tremor and her handwriting is much improved but she just feels "off center"  She states this sensation is not like the positional vertigo she has had in the past.  Her heart rates have been between 60 to 80 bpm but she has not been checking her BP.  She will be obtaining a monitor next week.  She is asking if she can go to a smaller dose of Propranolol (presently on 60 mg) to see if the balance issue will improve.  Advised I will forward this to Dr Marlou Porch for his review and call back with any new orders/recommendations.

## 2018-10-26 NOTE — Telephone Encounter (Signed)
Pt c/o medication issue:  1. Name of Medication: propranolol ER (INDERAL LA) 60 MG 24 hr capsule  2. How are you currently taking this medication (dosage and times per day)? Take 1 capsule (60 mg total) by mouth daily.  3. Are you having a reaction (difficulty breathing--STAT)? no  4. What is your medication issue?  she is wondering if she can reduce it to 30mg  because her sense of balance of off since she started taking it .

## 2018-10-27 MED ORDER — PROPRANOLOL HCL 40 MG PO TABS: 40.0000 mg | ORAL_TABLET | Freq: Every day | ORAL | 3 refills | Status: DC

## 2018-10-27 NOTE — Telephone Encounter (Signed)
Pt aware RX will be sent into [facility] as requested for a 90 day supply.

## 2018-10-27 NOTE — Telephone Encounter (Signed)
Propranolol comes in 10 mg, 20 mg, 40 mg and 60 mg CAPSULES.  Should she decrease to 40 mg, 20 mg or do you want her to take a 10 mg and 20 mg capsule daily?

## 2018-10-27 NOTE — Telephone Encounter (Signed)
Thank you for checking, lets go down to 40 mg. Candee Furbish, MD

## 2018-10-27 NOTE — Telephone Encounter (Signed)
Sure, let us try 30 mg of propranolol. Candee Furbish, MD

## 2018-11-09 ENCOUNTER — Other Ambulatory Visit: Payer: Self-pay | Admitting: Family Medicine

## 2018-11-09 DIAGNOSIS — Z1231 Encounter for screening mammogram for malignant neoplasm of breast: Secondary | ICD-10-CM

## 2018-12-13 ENCOUNTER — Ambulatory Visit: Payer: Medicare Other

## 2019-01-30 ENCOUNTER — Other Ambulatory Visit: Payer: Self-pay

## 2019-01-30 ENCOUNTER — Ambulatory Visit
Admission: RE | Admit: 2019-01-30 | Discharge: 2019-01-30 | Disposition: A | Discharge status: Home / Self Care | Payer: Medicare Other | Source: Ambulatory Visit | Attending: Family Medicine | Admitting: Family Medicine

## 2019-01-30 DIAGNOSIS — Z1231 Encounter for screening mammogram for malignant neoplasm of breast: Secondary | ICD-10-CM

## 2019-01-31 ENCOUNTER — Other Ambulatory Visit: Payer: Self-pay | Admitting: Family Medicine

## 2019-01-31 DIAGNOSIS — R928 Other abnormal and inconclusive findings on diagnostic imaging of breast: Secondary | ICD-10-CM

## 2019-02-02 ENCOUNTER — Ambulatory Visit
Admission: RE | Admit: 2019-02-02 | Discharge: 2019-02-02 | Disposition: A | Discharge status: Home / Self Care | Payer: Medicare Other | Source: Ambulatory Visit | Attending: Family Medicine | Admitting: Family Medicine

## 2019-02-02 ENCOUNTER — Other Ambulatory Visit: Payer: Self-pay

## 2019-02-02 ENCOUNTER — Ambulatory Visit: Admission: RE | Admit: 2019-02-02 | Payer: Medicare Other | Source: Ambulatory Visit

## 2019-02-02 DIAGNOSIS — R928 Other abnormal and inconclusive findings on diagnostic imaging of breast: Secondary | ICD-10-CM

## 2019-03-01 IMAGING — CT CT ABD-PELV W/ CM
2 of 5 series · 16 of 46 positions shown, 18 images · IV contrast (ISOVUE)
Comparison: PET-CT 03/11/2011

CLINICAL DATA: Abdomen pain with distention

EXAM:
CT ABDOMEN AND PELVIS WITH CONTRAST
TECHNIQUE: Multidetector CT imaging of the abdomen and pelvis was performed
using the standard protocol following bolus administration of
intravenous contrast.
CONTRAST:  100mL [id number] IOPAMIDOL ([id number]) INJECTION 61%

[Series 2: axial st · axial · 0.72mm/px · z∈[-562,-207]mm · 13 of 83 slices shown, 15 images]
[im 6/83  soft-tissue]
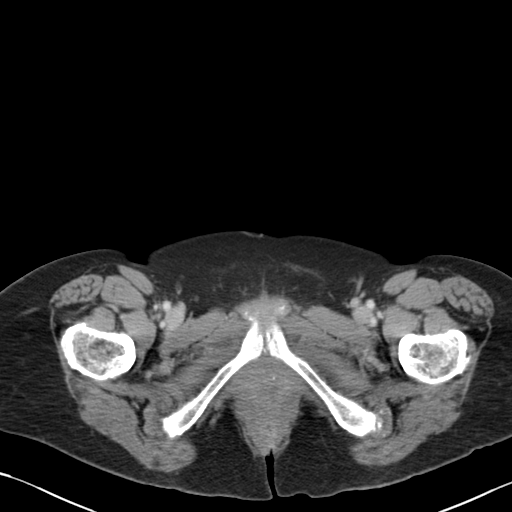
[im 6/83  bone]
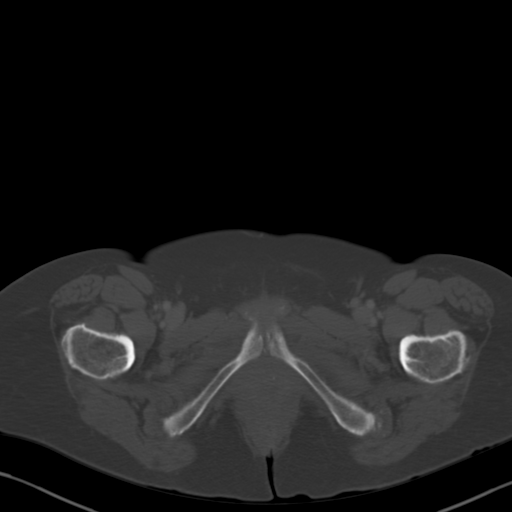
[im 11/83  soft-tissue]
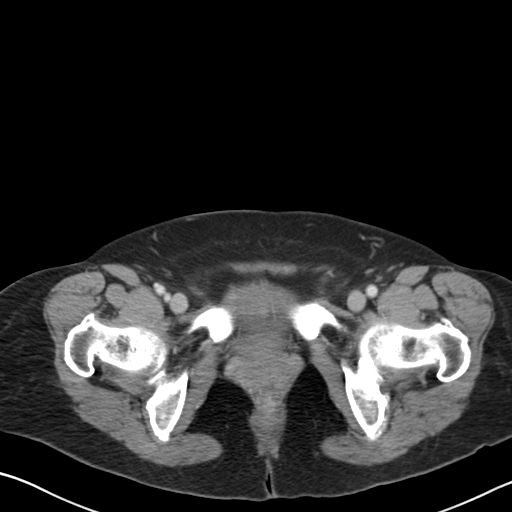
[im 17/83  soft-tissue]
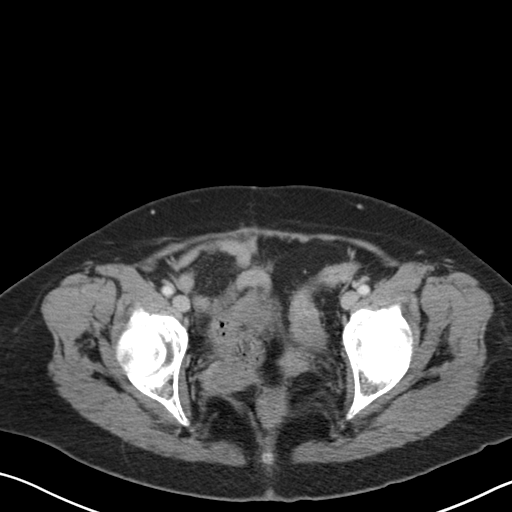
[im 22/83  soft-tissue]
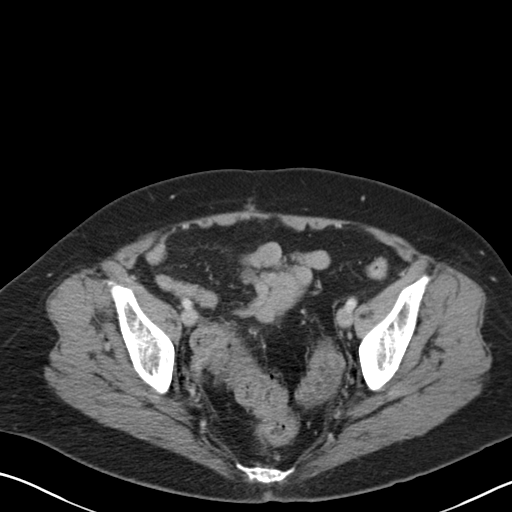
[im 28/83  soft-tissue]
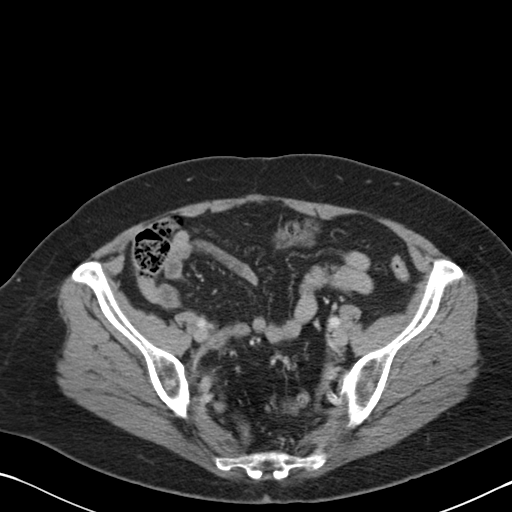
[im 33/83  soft-tissue]
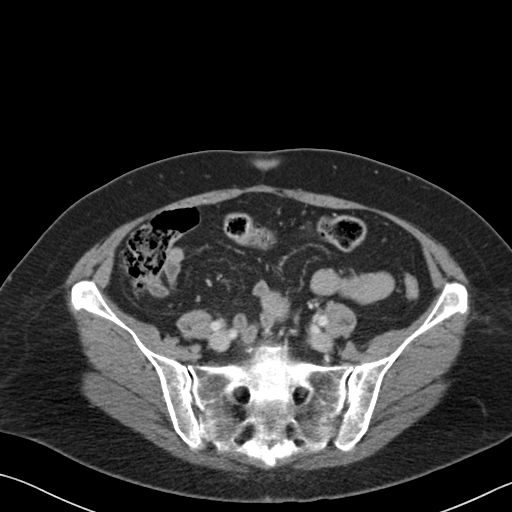
[im 44/83  soft-tissue]
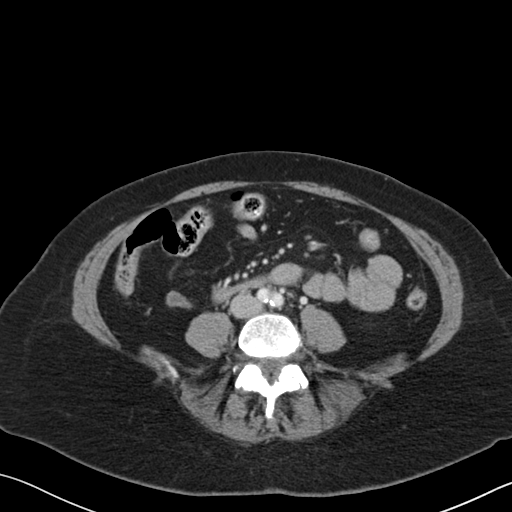
[im 50/83  soft-tissue]
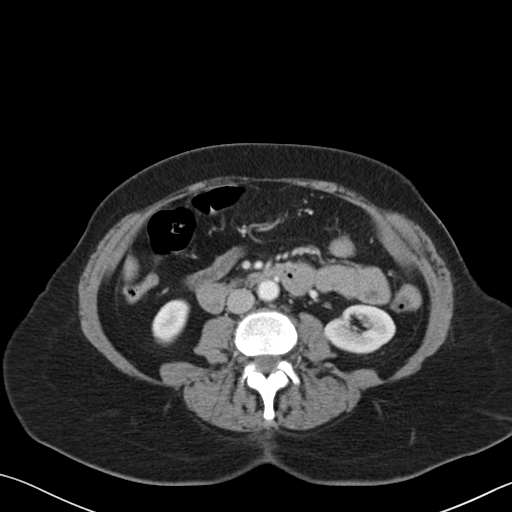
[im 55/83  soft-tissue]
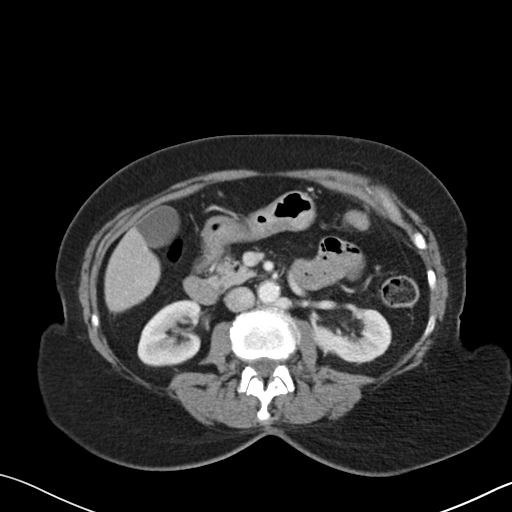
[im 55/83  bone]
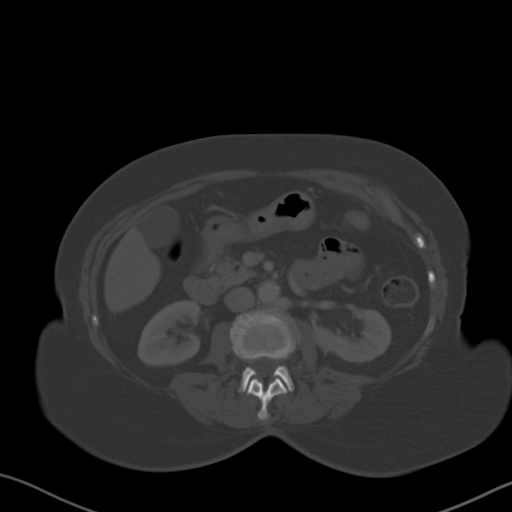
[im 61/83  soft-tissue]
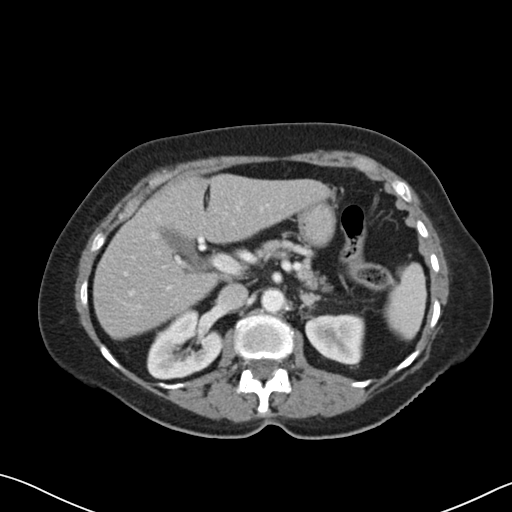
[im 66/83  soft-tissue]
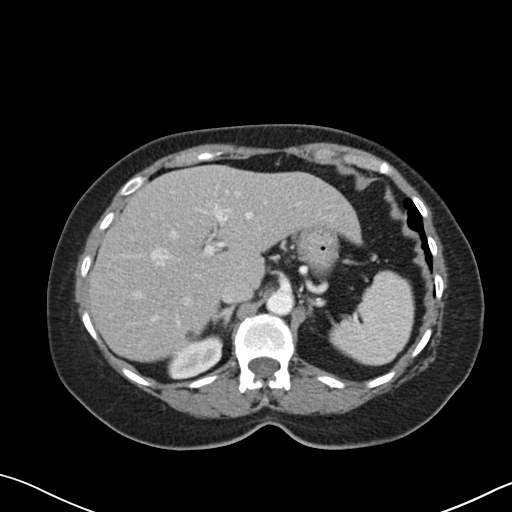
[im 72/83  soft-tissue]
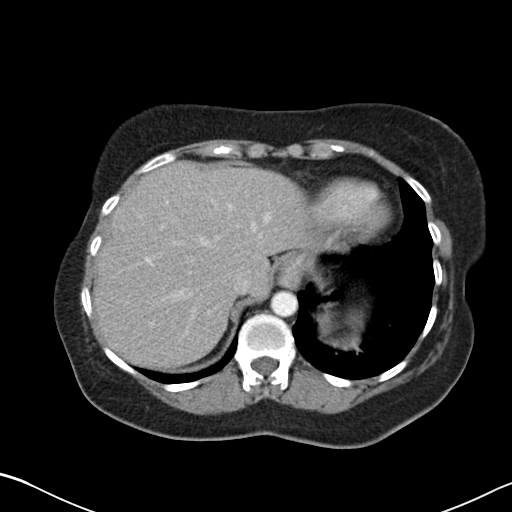
[im 77/83  soft-tissue]
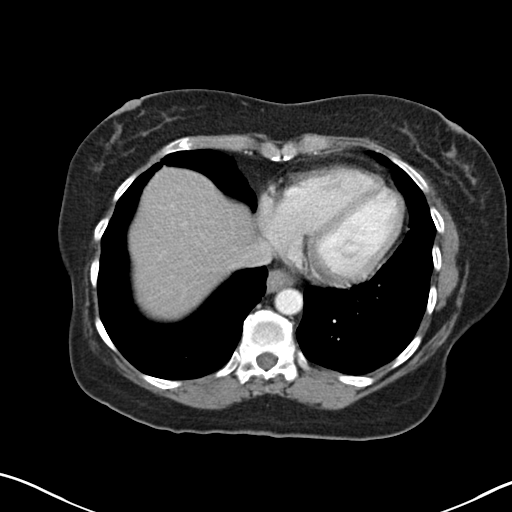

[Series 5: coronal st · coronal · 0.79mm/px · 3 of 92 slices shown]
[im 31/92  soft-tissue]
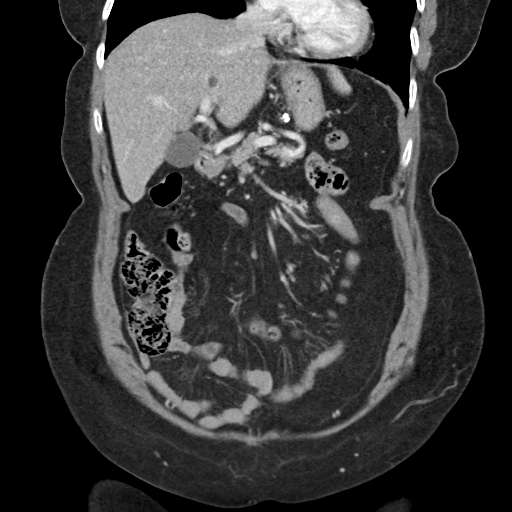
[im 41/92  soft-tissue]
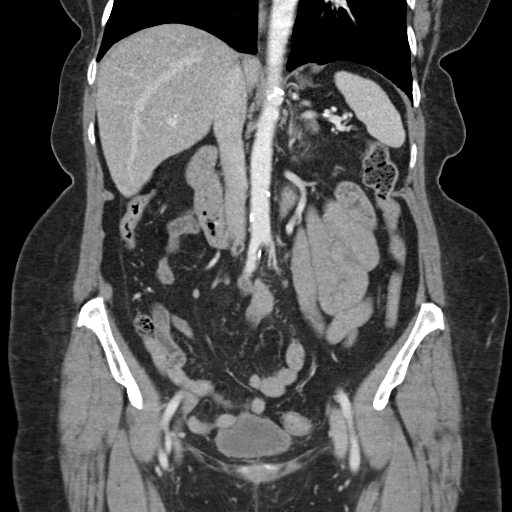
[im 51/92  soft-tissue]
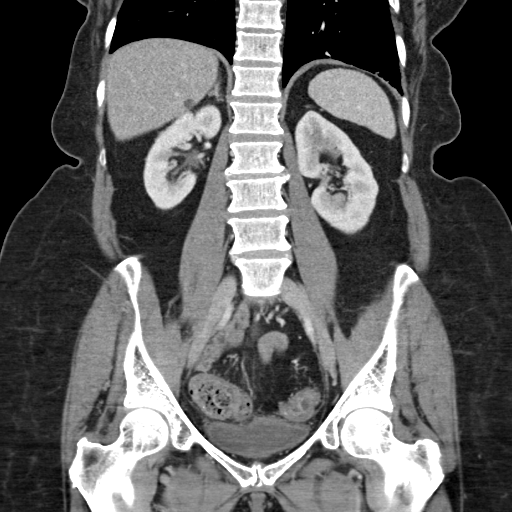

[16 of 46 positions shown; findings below may reference images not displayed]

FINDINGS: Lower chest: Lung bases demonstrate no acute consolidation pleural
effusion. Small hiatal hernia. Normal heart size.

Hepatobiliary: Small cysts in the liver. No calcified gallstones or
biliary dilatation.

Pancreas: Unremarkable. No pancreatic ductal dilatation or
surrounding inflammatory changes.

Spleen: Normal in size without focal abnormality.

Adrenals/Urinary Tract: Adrenal glands are unremarkable. Kidneys are
normal, without renal calculi, focal lesion, or hydronephrosis.
Bladder is unremarkable.

Stomach/Bowel: Stomach is nonenlarged. No dilated small bowel. The
appendix is not well seen consistent with history of appendectomy.

Mild colon wall thickening involving the distal descending and
rectosigmoid colon with indistinct appearance of the colon wall.
There are scattered diverticula present.

Vascular/Lymphatic: Moderate aortic atherosclerosis. No aneurysmal
dilatation. No significantly enlarged lymph nodes.

Reproductive: Status post hysterectomy. No adnexal masses.

Other: Negative for free air or free fluid.

Musculoskeletal: No acute or suspicious lesion
IMPRESSION: 1. Mild wall thickening involving the distal descending and
rectosigmoid colon with mild edema in the adjacent fat. Findings are
suggestive of a distal colitis as may be seen with infection,
inflammatory or ischemic bowel disease. There are scattered
diverticula within the sigmoid colon, however given length of
involvement and involvement of segments of bowel that do not have
diverticula, favor that this represents colitis over diverticulitis.
There is no evidence for perforation, abscess, or upstream
obstruction
2. Small hiatal hernia
3. Small liver cysts

## 2019-04-19 ENCOUNTER — Telehealth: Payer: Self-pay | Admitting: Cardiology

## 2019-04-19 NOTE — Telephone Encounter (Signed)
Pt c/o medication issue:  1. Name of Medication: Pro Propranolol  2. How are you currently taking this medication (dosage and times per day)  2 times a day- she cut back  To 10 mg   3. Are you having a reaction (difficulty breathing--STAT)? Dizziness and off balance  4. What is your medication issue? Medicine is  Making her feel bad

## 2019-04-19 NOTE — Telephone Encounter (Signed)
Spoke with patient who back in 10/2018 was feeling poorly taking Propranolol 60 mg daily and it was decreased to 40 mg daily.  She took 40 mg daily for alittle while but felt the same way so she decided herself to split the tablet and take 20 mg BID.  She continues to c/o dizziness and being off balance and for the last 1 and 1/2 months has taken 10 mg twice a day (on her own accord).  She reports her BP has been running around 140/60s and her HR has been in the 50s when it used to also be in the 70-80 range.  She reports she used to take Metoprolol succinate 25 mg daily and never had an issue with dizziness or her balance.  Her only complain with it was a small tremor.  She is asking to go back to the Metoprolol in place of the Propranolol.  Advised I will forward this information to Dr Marlou Porch for his review and the office will c/b with any new orders.  I also strongly encouraged her to call in the future with any medication concerns or side effects and to never change her medication dose or frequency without checking with her doctor 1st.  She states understanding and will await a c/b with further instructions.

## 2019-04-20 MED ORDER — METOPROLOL SUCCINATE ER 25 MG PO TB24: 25.0000 mg | ORAL_TABLET | Freq: Every day | ORAL | 3 refills | Status: DC

## 2019-04-20 NOTE — Telephone Encounter (Signed)
Pt advised and will continue to monitor and call back if she has any problems.

## 2019-04-20 NOTE — Telephone Encounter (Signed)
Stop propanolol Start back metoprolol succinate 25mg  PO QD. Thanks Candee Furbish, MD

## 2019-08-01 ENCOUNTER — Encounter: Payer: Self-pay | Admitting: Cardiology

## 2019-08-01 ENCOUNTER — Ambulatory Visit: Payer: Medicare Other | Admitting: Cardiology

## 2019-08-01 ENCOUNTER — Other Ambulatory Visit: Payer: Self-pay

## 2019-08-01 VITALS — BP 156/60 | HR 66 | Ht 63.0 in | Wt 152.0 lb

## 2019-08-01 DIAGNOSIS — G250 Essential tremor: Secondary | ICD-10-CM

## 2019-08-01 DIAGNOSIS — I10 Essential (primary) hypertension: Secondary | ICD-10-CM

## 2019-08-01 DIAGNOSIS — I5032 Chronic diastolic (congestive) heart failure: Secondary | ICD-10-CM

## 2019-08-01 DIAGNOSIS — R011 Cardiac murmur, unspecified: Secondary | ICD-10-CM

## 2019-08-01 NOTE — Progress Notes (Signed)
[facility]. [address] [place], [place]  [address] Phone: [phone] Fax:  [phone]  Date:  08/01/2019   ID:  Nicole [name], Nicole [name] Jan 21, 1933, MRN [id number]  PCP:  Harlan Stains, MD   History of Present Illness: SHAQUELA OLEA is a 85 y.o. female with hypertriglyceridemia, diastolic dysfunction, parotid gland disease. Diastolic dysfunction-on low-dose metoprolol. Prior uphill SOB has increased, moderate in severity, relieved with rest, no associated symptoms of chest discomfort or heaviness. CT of chest showed 2 small areas of concern. Dr. Arlyce Dice performed surgery.   She has had hypertriglyceridemia. This is improved significantly with fish oil. Down into the 150-190 range.  Traveling often. [place] trips. She stays in youth hostils. Enjoying time with her friend Shanon Brow.  [place] travel.   Lymphoma. Eye. 5 chemo tx. Rituxan. [facility].  Last 10/2014  Noted SOB with inclines (declined since knee pain has limited walking). No CP. No real change in symptoms. Parotid gland issues have resolved.  Her blood pressure was slightly elevated at home. She showed me home values. We decided to add back on HCTZ 12.5 mg.  07/30/17 - brother CABG 60.  Overall she is doing very well.  No chest pain, no significant change in shortness of breath, only when exerting herself significantly.  She had to curtail her travels recently, her significant other's dog has been sick.  She is going to a destination wedding in [place] soon.  Denies any syncope, bleeding, orthopnea, PND.  08/01/18 - [place] travel. [place] wedding. [place].  Doing well.  She does have an essential tremor.  She would like to transition from metoprolol over to propranolol.  This is fine.  No chest pain fevers chills nausea vomiting shortness of breath  08/01/2019-here for the follow-up of diastolic dysfunction, shortness of breath, family history of CAD.  Overall feels stable without any significant  fevers chills nausea vomiting syncope bleeding.  Medication adherence has been good.   Wt Readings from Last 3 Encounters:  08/01/19 152 lb (68.9 kg)  08/01/18 152 lb 12.8 oz (69.3 kg)  07/30/17 152 lb (68.9 kg)     Past Medical History:  Diagnosis Date  . Abscess of parotid gland 01/2011   w/parotid stones  . 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 Depression   . Heartburn   . IBS (irritable bowel syndrome)   . Non Hodgkin's lymphoma ([place])   . Personal history of colonic polyps 04/13/2007   hyperplastic  . Positional vertigo   . Reflux   . Sjogren's syndrome [facility])     Past Surgical History:  Procedure Laterality Date  . APPENDECTOMY    . BREAST BIOPSY Right 1986   Benign   . CATARACT EXTRACTION  2008 &2010   bilateral  . iredectomy  2008   left  . LUNG REMOVAL, PARTIAL  04/1999   right lung  . OOPHORECTOMY  2000  . SALIVARY GLAND SURGERY  01/2011 & 04/2011  . TONSILLECTOMY AND ADENOIDECTOMY    . VEIN SURGERY  1966    Current Outpatient Medications  Medication Sig Dispense Refill  . aspirin 81 MG tablet Take 81 mg by mouth daily.      . Biotin 1000 MCG tablet Take 1,000 mcg by mouth daily.    . Calcium Carbonate-Vitamin D (CALCIUM-VITAMIN D) 600-200 MG-UNIT CAPS Take 1 tablet by mouth daily.     . Cholecalciferol (VITAMIN D3) 1000 UNITS CAPS Take 1,000 Units by mouth daily.      Marland Kitchen  Flaxseed, Linseed, (FLAX SEED OIL) 1000 MG CAPS Take 1,000 mg by mouth daily.      . Glucosamine 750 MG TABS Take 750 mg by mouth 2 (two) times daily.     . hydrochlorothiazide (MICROZIDE) 12.5 MG capsule Take 1 capsule (12.5 mg total) by mouth daily. 90 capsule 3  . Methylsulfonylmethane (MSM) 1000 MG CAPS Take 1,000 mg by mouth daily.      . metoprolol succinate (TOPROL XL) 25 MG 24 hr tablet Take 1 tablet (25 mg total) by mouth daily. 90 tablet 3  . Multiple Vitamin (MULTIVITAMIN) capsule Take 1 capsule by mouth daily.      . OMEGA 3 1000 MG CAPS Take 1,000 mg by mouth 2 (two) times daily.       Marland Kitchen omeprazole (PRILOSEC) 10 MG capsule Take 10 mg by mouth every morning.    . ondansetron (ZOFRAN ODT) 4 MG disintegrating tablet Take 1 tablet (4 mg total) by mouth every 8 (eight) hours as needed for nausea or vomiting. 20 tablet 0  . Polyethyl Glycol-Propyl Glycol (SYSTANE OP) Apply 1 drop to eye every 4 (four) hours as needed (dry eyes). Both eyes    . PREMARIN 0.45 MG tablet Take 1 tablet by mouth daily.    . vitamin C (ASCORBIC ACID) 500 MG tablet Take 500 mg by mouth daily.       No current facility-administered medications for this visit.     Allergies:    Allergies  Allergen Reactions  . Ace Inhibitors     cough  . Protonix [Pantoprazole Sodium]     Dizziness    Social History:  The patient  reports that she has quit smoking. She has never used smokeless tobacco. She reports that she does not drink alcohol or use drugs.   Family History  Problem Relation Age of Onset  . Celiac disease Mother   . Heart disease Mother   . Heart disease Father   . Heart failure Father   . Kidney cancer Daughter     ROS:  Please see the history of present illness.  All other review of systems negative.  PHYSICAL EXAM: VS:  BP (!) 156/60   Pulse 66   Ht 5\' 3"  (1.6 m)   Wt 152 lb (68.9 kg)   LMP  (LMP Unknown)   SpO2 98%   BMI 26.93 kg/m  GEN: Well nourished, well developed, in no acute distress  HEENT: normal  Neck: no JVD, carotid bruits, or masses Cardiac: RRR; 2/6 systolic murmur, no rubs, or gallops,no edema  Respiratory:  clear to auscultation bilaterally, normal work of breathing GI: soft, nontender, nondistended, + BS MS: no deformity or atrophy  Skin: warm and dry, no rash Neuro:  Alert and Oriented x 3, Strength and sensation are intact Psych: euthymic mood, full affect     EKG:  Today-08/01/2019-sinus rhythm 66 bpm low voltage pattern with possible old inferior infarct pattern no change from prior 08/01/2018-sinus rhythm 66 low voltage inferior infarct pattern.  No  significant change from prior.  07/30/17-sinus rhythm 72, small inferior Q waves in 3 and aVF, overall borderline low voltage.  Personally viewed no significant changes.  07/29/16-sinus rhythm, 67, no other abnormalities. Personally viewed. 04/25/15-sinus rhythm, 63, low voltage, poor R-wave progression, personally viewed-04/25/14-sinus rhythm, no other abnormalities, baseline artifact.  ECHO: 07/21/12 - normal ejection fraction, calcified aortic valve (question murmur), trace MR, diastolic dysfunction grade 1  NUC: 07/21/12 - low risk, no ischemia, normal EF.  ASSESSMENT AND  PLAN:  1. Chronic diastolic heart failure-symptoms of shortness of breath has been chronic but seem to be improved. Notices when in a big hurry.  Fairly well controlled. Echocardiogram previously reassuring. Multifactorial also from deconditioning. Continue with metoprolol. Previous nuclear stress test reassuring. CT scan of chest reassuring with no parenchymal lung disease. No pericardial effusion.  We will continue with current treatment strategy. 2. Heart murmur-previously had trace mitral regurgitation, still hear murmur.  Want to make sure that there is no progression.  It is been 7 years since last ultrasound. 3. Hypertriglyceridemia-previous excellent response to fish oil.  Continue with diet as well.  Triglycerides previously 208 4. Hypertension-HCTZ 12.5 mg.  No changes made. 5. Essential tremor  - tried propranolol for essential tremor but did not help(side effects).  She is now back on metoprolol. Pulse is been in the 50s to 60s in the past, currently 60 s.  6. Lymphoma - ended therapy in 10/2014 [facility]. Doing well. Parotid Dr. Vivianne Master.  Thankfully, she continues to be in remission.  Doing well 7. Knee pain - Sees Dr. Latanya Maudlin.  Doing fine 8. Basal cell carcinoma of skin-sees Dr. Sarajane Jews of dermatology.  No changes 9. GERD - prilosec, skips the weekends.  Doing very well with this. 10. One-year followup.  Signed,  Candee Furbish, MD [facility]  08/01/2019 11:05 AM

## 2019-08-01 NOTE — Patient Instructions (Signed)
Medication Instructions:  The current medical regimen is effective;  continue present plan and medications.  *If you need a refill on your cardiac medications before your next appointment, please call your pharmacy*  Testing/Procedures: Your physician has requested that you have an echocardiogram. Echocardiography is a painless test that uses sound waves to create images of your heart. It provides your doctor with information about the size and shape of your heart and how well your heart's chambers and valves are working. This procedure takes approximately one hour. There are no restrictions for this procedure.  Follow-Up: At [facility], you and your health needs are our priority.  As part of our continuing mission to provide you with exceptional heart care, we have created designated Provider Care Teams.  These Care Teams include your primary Cardiologist (physician) and Advanced Practice Providers (APPs -  Physician Assistants and Nurse Practitioners) who all work together to provide you with the care you need, when you need it.  Your next appointment:   1 year(s)  The format for your next appointment:   In Person  Provider:   Candee Furbish, MD  Thank you for choosing [facility]!!

## 2019-08-08 ENCOUNTER — Other Ambulatory Visit: Payer: Self-pay | Admitting: Cardiology

## 2019-08-10 ENCOUNTER — Ambulatory Visit (HOSPITAL_COMMUNITY): Payer: Medicare Other | Attending: Cardiology

## 2019-08-10 ENCOUNTER — Other Ambulatory Visit: Payer: Self-pay

## 2019-08-10 DIAGNOSIS — R011 Cardiac murmur, unspecified: Secondary | ICD-10-CM | POA: Diagnosis not present

## 2019-09-27 ENCOUNTER — Ambulatory Visit: Payer: Medicare Other

## 2019-10-05 ENCOUNTER — Ambulatory Visit: Payer: Medicare Other

## 2020-01-17 ENCOUNTER — Other Ambulatory Visit: Payer: Self-pay | Admitting: Family Medicine

## 2020-01-17 DIAGNOSIS — Z1231 Encounter for screening mammogram for malignant neoplasm of breast: Secondary | ICD-10-CM

## 2020-02-05 ENCOUNTER — Ambulatory Visit
Admission: RE | Admit: 2020-02-05 | Discharge: 2020-02-05 | Disposition: A | Discharge status: Home / Self Care | Payer: Medicare Other | Source: Ambulatory Visit | Attending: Family Medicine | Admitting: Family Medicine

## 2020-02-05 ENCOUNTER — Other Ambulatory Visit: Payer: Self-pay

## 2020-02-05 DIAGNOSIS — Z1231 Encounter for screening mammogram for malignant neoplasm of breast: Secondary | ICD-10-CM

## 2020-04-16 ENCOUNTER — Encounter: Payer: Self-pay | Admitting: Physical Therapy

## 2020-04-16 ENCOUNTER — Ambulatory Visit: Payer: Medicare Other | Attending: Family Medicine | Admitting: Physical Therapy

## 2020-04-16 ENCOUNTER — Other Ambulatory Visit: Payer: Self-pay

## 2020-04-16 DIAGNOSIS — R2681 Unsteadiness on feet: Secondary | ICD-10-CM | POA: Diagnosis present

## 2020-04-16 DIAGNOSIS — H8112 Benign paroxysmal vertigo, left ear: Secondary | ICD-10-CM | POA: Insufficient documentation

## 2020-04-16 DIAGNOSIS — R262 Difficulty in walking, not elsewhere classified: Secondary | ICD-10-CM

## 2020-04-16 NOTE — Therapy (Signed)
[facility] [address] [place], [place], [address] Phone: [phone]   Fax:  [phone]  Physical Therapy Evaluation  Patient Details  Name: Nicole [name] MRN: [id number] Date of Birth: June 13, 1933 Referring Provider (PT): Dr. Donald Prose   Encounter Date: 04/16/2020   PT End of Session - 04/16/20 2208    Visit Number 1    Number of Visits 7   eval + 6 visits   Date for PT Re-Evaluation 05/30/20    Authorization Type UHC Medicare    Authorization Time Period 04-16-20 - 06-16-20    PT Start Time 1102    PT Stop Time 1148    PT Time Calculation (min) 46 min    Activity Tolerance Patient tolerated treatment well    Behavior During Therapy [facility] for tasks assessed/performed           Past Medical History:  Diagnosis Date  . Abscess of parotid gland 01/2011   w/parotid stones  . 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 Depression   . Heartburn   . IBS (irritable bowel syndrome)   . Non Hodgkin's lymphoma ([place])   . Personal history of colonic polyps 04/13/2007   hyperplastic  . Positional vertigo   . Reflux   . Sjogren's syndrome [facility])     Past Surgical History:  Procedure Laterality Date  . APPENDECTOMY    . BREAST BIOPSY Right 1986   Benign   . CATARACT EXTRACTION  2008 &2010   bilateral  . iredectomy  2008   left  . LUNG REMOVAL, PARTIAL  04/1999   right lung  . OOPHORECTOMY  2000  . SALIVARY GLAND SURGERY  01/2011 & 04/2011  . TONSILLECTOMY AND ADENOIDECTOMY    . VEIN SURGERY  1966    There were no vitals filed for this visit.    Subjective Assessment - 04/16/20 1107    Subjective Pt states she had initial onset of vertigo in 1991 or 1992; states now she has vertigo when she lies down and does NOT want to close her eyes (which she says this is different than it was before in 2018) ; felt the vertigo this morning when she was walking arond her house.  Reports she first took Meclizine in 2019 but unsure  if it helped then as it made her fall asleep; statesrecently she has started having nausea with it    Pertinent History Sjogren's syndrome: chronic diastolic heart failure: lymphoma; h/o abscess of parotid gland; IBS; depression; essential tremor; lymphoma; abnormal CAT scan    Patient Stated Goals "Resolve the vertigo or make it not quite so prominent"    Currently in Pain? No/denies              [facility] PT Assessment - 04/16/20 1113      Assessment   Medical Diagnosis BPPV    Referring Provider (PT) Dr. Donald Prose    Onset Date/Surgical Date 02/28/20    Next MD Visit --    Prior Therapy PT at this facility Oct. -Dec. 2018      Precautions   Precautions Fall      Balance Screen   Has the patient fallen in the past 6 months Yes   November 26, 2019   How many times? 1    Has the patient had a decrease in activity level because of a fear of falling?  No    Is the patient reluctant to leave their home because of a  fear of falling?  No      Home Environment   Living Environment Private residence    Living Arrangements Alone    Type of [place] One level      Observation/Other Assessments   Focus on Therapeutic Outcomes (FOTO)  pt's FS primary measure 57/100 ; risk adjusted 60/100     Other Surveys  Dizziness Handicap Inventory [facility])    Dizziness Handicap Inventory (DHI)  36% (moderate handicap)                  Vestibular Assessment - 04/16/20 0001      Vestibular Assessment   General Observation pt is an 86 yr old lady amb. independently without device with c/o intermittent vertigo - provoked more when she looks up or when she turns over in bed       Symptom Behavior   Subjective history of current problem pt reports onset of this vertiginous episode on 02-28-20      Type of Dizziness  "Funny feeling in head";Spinning;Imbalance    Frequency of Dizziness usually daily    Duration of Dizziness minutes    Symptom Nature Positional    Aggravating  Factors Sitting with head tilted back;Looking up to the ceiling;Rolling to left    Relieving Factors Rest;Head stationary    Progression of Symptoms Better    History of similar episodes had episode in 2018; this episode started on 02-28-20      Oculomotor Exam   Oculomotor Alignment Normal    Spontaneous Absent      Positional Testing   Dix-Hallpike Dix-Hallpike Right;Dix-Hallpike Left    Sidelying Test Sidelying Right;Sidelying Left      Dix-Hallpike Right   Dix-Hallpike Right Duration none    Dix-Hallpike Right Symptoms No nystagmus      Dix-Hallpike Left   Dix-Hallpike Left Duration approx. 1"    Dix-Hallpike Left Symptoms Upbeat, left rotatory nystagmus;Downbeat, left rotatory nystagmus   downbeat in 2nd position of Epley's     Sidelying Right   Sidelying Right Duration none    Sidelying Right Symptoms No nystagmus      Sidelying Left   Sidelying Left Duration approx. 12 secs    Sidelying Left Symptoms Upbeat, left rotatory nystagmus              Objective measurements completed on examination: See above findings.     Epley maneuver for Lt BPPV posterior canal performed 3 reps  - improvement in nystagmus noted and in c/o vertigo on 3rd rep of maneuver          PT Education - 04/16/20 2206    Education Details pt given article on BPPV etiology from [facility]    Person(s) Educated Patient    Methods Explanation    Comprehension Verbalized understanding            PT Short Term Goals - 04/16/20 2233      PT SHORT TERM GOAL #1   Title Complete balance assessments including DGI and TUG after Lt BPPV is resolved.    Time 3    Period Weeks    Status New    Target Date 05/10/20      PT SHORT TERM GOAL #2   Title Pt will be independent in HEP for balance and habituation exs.    Time 3    Period Weeks    Status New    Target Date 05/10/20  PT Long Term Goals - 04/16/20 2237      PT LONG TERM GOAL #1   Title Pt will be independent in  updated HEP as appropriate.    Time 7    Period Weeks    Status New    Target Date 05/30/20      PT LONG TERM GOAL #2   Title Pt will improve DGI score by at least 4 points to demo increased safety with amb.    Time 7    Period Weeks    Status New    Target Date 05/30/20      PT LONG TERM GOAL #3   Title Pt will have a (-) Lt Dix-Hallpike test to indicate resolution of Lt BPPV.    Time 7    Period Weeks    Status New    Target Date 05/30/20      PT LONG TERM GOAL #4   Title Improve DHI score from 36% to </= 26% to demo improvement in vertigo.    Baseline 36 on 04-16-20    Time 7    Period Weeks    Status New    Target Date 05/30/20                  Plan - 04/16/20 2214    Clinical Impression Statement Pt is an 86 yr old lady with c/o episodic vertigo with current episode having started on 02-28-20.  Pt has (+) Lt Dix-Hallpike test with Lt rotary upbeating nystagmus (indicative of Lt posterior canalithiasis) and also with Lt rotary downbeating nystagmus noted during 2nd position of Epley maneuver, indicative of Lt posterior cupulolithiasis.  Symptoms improved on 3rd rep of Epley's but not fully resolved.  Pt requests to also address balance deficits during PT program.    Personal Factors and Comorbidities Behavior Pattern;Comorbidity 2;Past/Current Experience    Comorbidities Lymphoma, depression, h/o abscess of parotid gland, chronic diastolic heart failure, IBS, non-Hodgkins lymphoma, Sjogren's syndrome    Examination-Activity Limitations Transfers;Bend;Stairs;Reach Overhead;Locomotion Level;Squat    Examination-Participation Restrictions Cleaning;Shop;Community Activity;Laundry;Meal Prep    Stability/Clinical Decision Making Evolving/Moderate complexity    Clinical Decision Making Moderate    Rehab Potential Good    PT Frequency 1x / week    PT Duration 6 weeks   + eval for 7 visits total   PT Treatment/Interventions ADLs/Self Care Home Management;Canalith  Repostioning;Gait training;Stair training;Therapeutic activities;Therapeutic exercise;Balance training;Neuromuscular re-education;Patient/family education;Vestibular    PT Next Visit Plan Recheck Lt BPPV - Epley's if needed: do TUG and DGI; if time allows begin HEP for balance and habituation as appropriate for vertigo    Consulted and Agree with Plan of Care Patient           Patient will benefit from skilled therapeutic intervention in order to improve the following deficits and impairments:  Difficulty walking, Decreased balance, Dizziness  Visit Diagnosis: BPPV (benign paroxysmal positional vertigo), left - Plan: PT plan of care cert/re-cert  Unsteadiness on feet - Plan: PT plan of care cert/re-cert  Difficulty in walking, not elsewhere classified - Plan: PT plan of care cert/re-cert     Problem List Patient Active Problem List   Diagnosis Date Noted  . Chronic parotitis 11/29/2015  . Parotid sialolithiasis 11/29/2015  . Rhinitis, chronic 11/29/2015  . Arthritis 04/25/2015  . 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 History of biliary T-tube placement 04/25/2015  . Non Hodgkin's lymphoma ([place]) 04/25/2015  . Ocular rosacea 04/25/2015  . Chronic diastolic heart failure ([place]) 04/25/2014  . Essential  hypertension 04/25/2014  . Hypertriglyceridemia 04/25/2014  . Positional vertigo   . Sjogren's disease ([place])   . IBS (irritable bowel syndrome)   . Reflux   . Depression   . Abnormal CAT scan 01/17/2013  . Chronic cough 01/17/2013  . 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 Marginal zone lymphoma 07/17/2011  . Abscess of parotid gland 01/30/2011  . Personal history of colonic polyps 04/13/2007    Alda Lea, PT 04/16/2020, 10:52 PM  [facility] [address] [place], [place], [address] Phone: [phone]   Fax:  [phone]  Name: Nicole [name] MRN: [id number] Date of Birth: 1932/10/07

## 2020-04-24 ENCOUNTER — Ambulatory Visit: Payer: Medicare Other | Admitting: Physical Therapy

## 2020-04-24 ENCOUNTER — Encounter: Payer: Self-pay | Admitting: Physical Therapy

## 2020-04-24 ENCOUNTER — Other Ambulatory Visit: Payer: Self-pay

## 2020-04-24 DIAGNOSIS — R2681 Unsteadiness on feet: Secondary | ICD-10-CM

## 2020-04-24 DIAGNOSIS — H8112 Benign paroxysmal vertigo, left ear: Secondary | ICD-10-CM | POA: Diagnosis not present

## 2020-04-24 NOTE — Patient Instructions (Signed)
Access Code: [email or web address] URL: [email or web address] Date: 04/24/2020 Prepared by: Willow Ora  Exercises . Right Side Lying to Left Side Lying Vestibular Habituation - 1 x daily - 5 x weekly - 1 sets - 3 reps

## 2020-04-24 NOTE — Therapy (Signed)
[facility] [address] [place], [place], [address] Phone: [phone]   Fax:  [phone]  Physical Therapy Treatment  Patient Details  Name: Nicole [name] MRN: [id number] Date of Birth: Sep 25, 1932 Referring Provider (PT): Dr. Donald Prose   Encounter Date: 04/24/2020   PT End of Session - 04/24/20 0939    Visit Number 2    Number of Visits 7   eval + 6 visits   Date for PT Re-Evaluation 05/30/20    Authorization Type UHC Medicare    Authorization Time Period 04-16-20 - 06-16-20    PT Start Time 0932    PT Stop Time 1015    PT Time Calculation (min) 43 min    Activity Tolerance Patient tolerated treatment well    Behavior During Therapy [facility] for tasks assessed/performed           Past Medical History:  Diagnosis Date  . Abscess of parotid gland 01/2011   w/parotid stones  . 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 Depression   . Heartburn   . IBS (irritable bowel syndrome)   . Non Hodgkin's lymphoma ([place])   . Personal history of colonic polyps 04/13/2007   hyperplastic  . Positional vertigo   . Reflux   . Sjogren's syndrome [facility])     Past Surgical History:  Procedure Laterality Date  . APPENDECTOMY    . BREAST BIOPSY Right 1986   Benign   . CATARACT EXTRACTION  2008 &2010   bilateral  . iredectomy  2008   left  . LUNG REMOVAL, PARTIAL  04/1999   right lung  . OOPHORECTOMY  2000  . SALIVARY GLAND SURGERY  01/2011 & 04/2011  . TONSILLECTOMY AND ADENOIDECTOMY    . VEIN SURGERY  1966    There were no vitals filed for this visit.   Subjective Assessment - 04/24/20 0935    Subjective No new complatins. No falls. Dizziness/symptoms has been better, still there some such as when she turns over, or lies down at night. Having some sensations now, "spinning in her head". "If I was home I would take half a meclazine". Has not taken one since seeing PT last week.    Pertinent History Sjogren's syndrome: chronic  diastolic heart failure: lymphoma; h/o abscess of parotid gland; IBS; depression; essential tremor; lymphoma; abnormal CAT scan    Patient Stated Goals "Resolve the vertigo or make it not quite so prominent"    Currently in Pain? No/denies               Vestibular Assessment - 04/24/20 0940      Dix-Hallpike Left   Dix-Hallpike Left Duration ~50 sec's    Dix-Hallpike Left Symptoms Downbeat, left rotatory nystagmus               Vestibular Treatment/Exercise - 04/24/20 0940      Vestibular Treatment/Exercise   Vestibular Treatment Provided Canalith Repositioning    Canalith Repositioning Epley Manuever Left    Habituation Exercises Horizontal Roll       EPLEY MANUEVER LEFT   Number of Reps  1    Overall Response  No change     RESPONSE DETAILS LEFT initial worsening of symtoms that resolved to baseline after each position was obtained within 1-2 minutes each time.      Horizontal Roll   Number of Reps  3   toward each side   Symptom Description  less intensity of increased symptoms with each consecutive  rep. Decreased recovery time as reps progressed. Incr time needed after each roll for symptoms to improve. Added to HEP.                  PT Education - 04/24/20 1320    Education Details added rolling for habituation to HEP    Person(s) Educated Patient    Methods Explanation;Demonstration;Verbal cues;Handout    Comprehension Verbalized understanding;Returned demonstration;Verbal cues required;Need further instruction            PT Short Term Goals - 04/16/20 2233      PT SHORT TERM GOAL #1   Title Complete balance assessments including DGI and TUG after Lt BPPV is resolved.    Time 3    Period Weeks    Status New    Target Date 05/10/20      PT SHORT TERM GOAL #2   Title Pt will be independent in HEP for balance and habituation exs.    Time 3    Period Weeks    Status New    Target Date 05/10/20             PT Long Term Goals - 04/16/20  2237      PT LONG TERM GOAL #1   Title Pt will be independent in updated HEP as appropriate.    Time 7    Period Weeks    Status New    Target Date 05/30/20      PT LONG TERM GOAL #2   Title Pt will improve DGI score by at least 4 points to demo increased safety with amb.    Time 7    Period Weeks    Status New    Target Date 05/30/20      PT LONG TERM GOAL #3   Title Pt will have a (-) Lt Dix-Hallpike test to indicate resolution of Lt BPPV.    Time 7    Period Weeks    Status New    Target Date 05/30/20      PT LONG TERM GOAL #4   Title Improve DHI score from 36% to </= 26% to demo improvement in vertigo.    Baseline 36 on 04-16-20    Time 7    Period Weeks    Status New    Target Date 05/30/20                 Plan - 04/24/20 0940    Clinical Impression Statement Today's skilled session continued to address vertigo. Pt continues with left rotary downbeating nystagmus with Eply today. Only one rep performed today due to nausea afterwards. Educated pt on and issued horizontal rolling for habituation to HEP. No significant issues with performance in session today. The pt is progressing and should benefit from continued PT to progress toward umet goals.    Personal Factors and Comorbidities Behavior Pattern;Comorbidity 2;Past/Current Experience    Comorbidities Lymphoma, depression, h/o abscess of parotid gland, chronic diastolic heart failure, IBS, non-Hodgkins lymphoma, Sjogren's syndrome    Examination-Activity Limitations Transfers;Bend;Stairs;Reach Overhead;Locomotion Level;Squat    Examination-Participation Restrictions Cleaning;Shop;Community Activity;Laundry;Meal Prep    Stability/Clinical Decision Making Evolving/Moderate complexity    Rehab Potential Good    PT Frequency 1x / week    PT Duration 6 weeks   + eval for 7 visits total   PT Treatment/Interventions ADLs/Self Care Home Management;Canalith Repostioning;Gait training;Stair training;Therapeutic  activities;Therapeutic exercise;Balance training;Neuromuscular re-education;Patient/family education;Vestibular    PT Next Visit Plan Recheck Lt BPPV - Epley's if  needed: do TUG and DGI; if time allows begin HEP for balance and habituation as appropriate for vertigo    PT Home Exercise Plan Access Code: [email or web address]    Consulted and Agree with Plan of Care Patient           Patient will benefit from skilled therapeutic intervention in order to improve the following deficits and impairments:  Difficulty walking, Decreased balance, Dizziness  Visit Diagnosis: BPPV (benign paroxysmal positional vertigo), left  Unsteadiness on feet     Problem List Patient Active Problem List   Diagnosis Date Noted  . Chronic parotitis 11/29/2015  . Parotid sialolithiasis 11/29/2015  . Rhinitis, chronic 11/29/2015  . Arthritis 04/25/2015  . 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 History of biliary T-tube placement 04/25/2015  . Non Hodgkin's lymphoma ([place]) 04/25/2015  . Ocular rosacea 04/25/2015  . Chronic diastolic heart failure ([place]) 04/25/2014  . Essential hypertension 04/25/2014  . Hypertriglyceridemia 04/25/2014  . Positional vertigo   . Sjogren's disease ([place])   . IBS (irritable bowel syndrome)   . Reflux   . Depression   . Abnormal CAT scan 01/17/2013  . Chronic cough 01/17/2013  . 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 Marginal zone lymphoma 07/17/2011  . Abscess of parotid gland 01/30/2011  . Personal history of colonic polyps 04/13/2007   Willow Ora, PTA, [facility] [address] [place], [place] [address] [phone] 04/24/20, 1:25 PM   Name: Nicole [name] MRN: [id number] Date of Birth: [birth year]

## 2020-04-30 ENCOUNTER — Ambulatory Visit: Payer: Medicare Other | Admitting: Physical Therapy

## 2020-04-30 ENCOUNTER — Other Ambulatory Visit: Payer: Self-pay

## 2020-04-30 ENCOUNTER — Encounter: Payer: Self-pay | Admitting: Physical Therapy

## 2020-04-30 DIAGNOSIS — H8112 Benign paroxysmal vertigo, left ear: Secondary | ICD-10-CM | POA: Diagnosis not present

## 2020-04-30 DIAGNOSIS — R262 Difficulty in walking, not elsewhere classified: Secondary | ICD-10-CM

## 2020-04-30 DIAGNOSIS — R2681 Unsteadiness on feet: Secondary | ICD-10-CM

## 2020-04-30 NOTE — Patient Instructions (Signed)
Access Code: [email or web address] URL: [email or web address] Date: 04/30/2020 Prepared by: Willow Ora  Exercises Right Side Lying to Left Side Lying Vestibular Habituation - 1 x daily - 5 x weekly - 1 sets - 3 reps Standing Balance in Corner with Eyes Closed - 1 x daily - 5 x weekly - 1 sets - 3 reps - 30 hold Standing Gaze Stabilization with Head Nod - 1 x daily - 5 x weekly - 1 sets - 20 hold

## 2020-04-30 NOTE — Therapy (Signed)
[facility] [address] [place], [place], [address] Phone: [phone]   Fax:  [phone]  Physical Therapy Treatment  Patient Details  Name: Nicole [name] MRN: [id number] Date of Birth: Aug 13, 1933 Referring Provider (PT): Dr. Donald Prose   Encounter Date: 04/30/2020   PT End of Session - 04/30/20 0939    Visit Number 3    Number of Visits 7   eval + 6 visits   Date for PT Re-Evaluation 05/30/20    Authorization Type UHC Medicare    Authorization Time Period 04-16-20 - 06-16-20    PT Start Time 0933    PT Stop Time 1015    PT Time Calculation (min) 42 min    Activity Tolerance Patient tolerated treatment well    Behavior During Therapy [facility] for tasks assessed/performed           Past Medical History:  Diagnosis Date  . Abscess of parotid gland 01/2011   w/parotid stones  . 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 Depression   . Heartburn   . IBS (irritable bowel syndrome)   . Non Hodgkin's lymphoma ([place])   . Personal history of colonic polyps 04/13/2007   hyperplastic  . Positional vertigo   . Reflux   . Sjogren's syndrome [facility])     Past Surgical History:  Procedure Laterality Date  . APPENDECTOMY    . BREAST BIOPSY Right 1986   Benign   . CATARACT EXTRACTION  2008 &2010   bilateral  . iredectomy  2008   left  . LUNG REMOVAL, PARTIAL  04/1999   right lung  . OOPHORECTOMY  2000  . SALIVARY GLAND SURGERY  01/2011 & 04/2011  . TONSILLECTOMY AND ADENOIDECTOMY    . VEIN SURGERY  1966    There were no vitals filed for this visit.   Subjective Assessment - 04/30/20 0937    Subjective No new complaints. No falls. Reports dizziness has been minimal this week. Only missed one day of her HEP.    Pertinent History Sjogren's syndrome: chronic diastolic heart failure: lymphoma; h/o abscess of parotid gland; IBS; depression; essential tremor; lymphoma; abnormal CAT scan    Patient Stated Goals "Resolve the vertigo  or make it not quite so prominent"    Currently in Pain? No/denies              [facility] PT Assessment - 04/30/20 0939      Standardized Balance Assessment   Standardized Balance Assessment Dynamic Gait Index;Timed Up and Go Test      Dynamic Gait Index   Level Surface Mild Impairment    Change in Gait Speed Mild Impairment    Gait with Horizontal Head Turns Normal    Gait with Vertical Head Turns Moderate Impairment    Gait and Pivot Turn Mild Impairment    Step Over Obstacle Mild Impairment    Step Around Obstacles Normal    Steps Mild Impairment    Total Score 17    DGI comment: <19 = high risk for falls.      Timed Up and Go Test   TUG Normal TUG    Normal TUG (seconds) 11.65    TUG Comments no dizziness with testing/turning, only reported feeling "off balance".                 [facility] Adult PT Treatment/Exercise - 04/30/20 0939      Transfers   Transfers Sit to Stand;Stand to Sit  Ambulation/Gait   Ambulation/Gait Yes    Ambulation/Gait Assistance 5: Supervision    Ambulation/Gait Assistance Details around gym with session    Assistive device None    Gait Pattern Step-through pattern;Decreased stride length;Trunk flexed    Ambulation Surface Level;Indoor      Neuro Re-ed    Neuro Re-ed Details  for balance/habituation: seated X1 eye ex's for 20 sec's each with no symptoms reported. standing x1 eye exercises with no symptoms with horizontal direction, mild increase with vertical direction. added vertical x1 ex's to HEP. standing in corner with wide BOS with EC 30 sec's x 3 reps. min guard assist with wall needed at times. added to HEP as well.            issued to HEP today:  Access Code: [email or web address] URL: [email or web address] Date: 04/30/2020 Prepared by: Willow Ora  Exercises Right Side Lying to Left Side Lying Vestibular Habituation - 1 x daily - 5 x weekly - 1 sets - 3 reps Standing Balance in Corner with Eyes Closed - 1 x daily - 5  x weekly - 1 sets - 3 reps - 30 hold Standing Gaze Stabilization with Head Nod - 1 x daily - 5 x weekly - 1 sets - 20 hold       PT Education - 04/30/20 1039    Education Details Results of Time Up and Go test, DGI score. additions to HEP.    Person(s) Educated Patient    Methods Explanation;Demonstration;Verbal cues;Handout    Comprehension Verbalized understanding;Returned demonstration;Verbal cues required;Need further instruction            PT Short Term Goals - 04/30/20 1737      PT SHORT TERM GOAL #1   Title Complete balance assessments including DGI and TUG after Lt BPPV is resolved.    Baseline 04/30/20: met today with TUG 11.65 sec's no AD, DGI score 17/24    Status Achieved    Target Date 05/10/20      PT SHORT TERM GOAL #2   Title Pt will be independent in HEP for balance and habituation exs.    Time 3    Period Weeks    Status On-going    Target Date 05/10/20             PT Long Term Goals - 04/30/20 1738      PT LONG TERM GOAL #1   Title Pt will be independent in updated HEP as appropriate. (all LTGs due 05/30/20)    Time 7    Period Weeks    Status On-going      PT LONG TERM GOAL #2   Title Pt will improve DGI score by at least 4 points to demo increased safety with amb.    Baseline 04/30/20: baseline 17/24    Time 7    Period Weeks    Status On-going      PT LONG TERM GOAL #3   Title Pt will have a (-) Lt Dix-Hallpike test to indicate resolution of Lt BPPV.    Time 7    Period Weeks    Status On-going      PT LONG TERM GOAL #4   Title Improve DHI score from 36% to </= 26% to demo improvement in vertigo.    Baseline 36 on 04-16-20    Time 7    Period Weeks    Status On-going                 Plan -  04/30/20 5038    Clinical Impression Statement Today's skilled session focused on establishing baseline for Timed up and go with time of 11.65 sec's no AD and the Dynamic Gait Index with a score of 17/24. Goals to be updated as indicated.  Remainder of session focused on additions to HEP to address habituation and balance with no issues reported or noted in session.    Personal Factors and Comorbidities Behavior Pattern;Comorbidity 2;Past/Current Experience    Comorbidities Lymphoma, depression, h/o abscess of parotid gland, chronic diastolic heart failure, IBS, non-Hodgkins lymphoma, Sjogren's syndrome    Examination-Activity Limitations Transfers;Bend;Stairs;Reach Overhead;Locomotion Level;Squat    Examination-Participation Restrictions Cleaning;Shop;Community Activity;Laundry;Meal Prep    Stability/Clinical Decision Making Evolving/Moderate complexity    Rehab Potential Good    PT Frequency 1x / week    PT Duration 6 weeks   + eval for 7 visits total   PT Treatment/Interventions ADLs/Self Care Home Management;Canalith Repostioning;Gait training;Stair training;Therapeutic activities;Therapeutic exercise;Balance training;Neuromuscular re-education;Patient/family education;Vestibular    PT Next Visit Plan continue to check left BPPV as indicated; continue to work on habituation ex's and balance with vision removed progressing to complaint surfaces as able.    PT Home Exercise Plan Access Code: [email or web address]    [email or web address] and Agree with Plan of Care Patient           Patient will benefit from skilled therapeutic intervention in order to improve the following deficits and impairments:  Difficulty walking, Decreased balance, Dizziness  Visit Diagnosis: Unsteadiness on feet  Difficulty in walking, not elsewhere classified     Problem List Patient Active Problem List   Diagnosis Date Noted  . Chronic parotitis 11/29/2015  . Parotid sialolithiasis 11/29/2015  . Rhinitis, chronic 11/29/2015  . Arthritis 04/25/2015  . 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 History of biliary T-tube placement 04/25/2015  . Non Hodgkin's lymphoma ([place]) 04/25/2015  . Ocular rosacea 04/25/2015  . Chronic diastolic heart failure ([place]) 04/25/2014  .  Essential hypertension 04/25/2014  . Hypertriglyceridemia 04/25/2014  . Positional vertigo   . Sjogren's disease ([place])   . IBS (irritable bowel syndrome)   . Reflux   . Depression   . Abnormal CAT scan 01/17/2013  . Chronic cough 01/17/2013  . 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 Marginal zone lymphoma 07/17/2011  . Abscess of parotid gland 01/30/2011  . Personal history of colonic polyps 04/13/2007    Willow Ora, PTA, [facility] Outpatient Neuro [facility] [address], [place], [place] [address] [phone] 04/30/20, 5:40 PM   Name: Nicole [name] MRN: [id number] Date of Birth: [birth year]

## 2020-05-09 ENCOUNTER — Other Ambulatory Visit: Payer: Self-pay

## 2020-05-09 ENCOUNTER — Encounter: Payer: Self-pay | Admitting: Physical Therapy

## 2020-05-09 ENCOUNTER — Ambulatory Visit: Payer: Medicare Other | Attending: Family Medicine | Admitting: Physical Therapy

## 2020-05-09 DIAGNOSIS — R2681 Unsteadiness on feet: Secondary | ICD-10-CM | POA: Insufficient documentation

## 2020-05-09 DIAGNOSIS — R262 Difficulty in walking, not elsewhere classified: Secondary | ICD-10-CM | POA: Diagnosis present

## 2020-05-10 NOTE — Therapy (Signed)
[facility] [address] [place], [place], [address] Phone: [phone]   Fax:  [phone]  Physical Therapy Treatment  Patient Details  Name: Nicole [name] MRN: [id number] Date of Birth: 1933-05-28 Referring Provider (PT): Dr. Donald Prose   Encounter Date: 05/09/2020   PT End of Session - 05/09/20 1104    Visit Number 4    Number of Visits 7   eval + 6 visits   Date for PT Re-Evaluation 05/30/20    Authorization Type UHC Medicare    Authorization Time Period 04-16-20 - 06-16-20    PT Start Time 1102    PT Stop Time 1145    PT Time Calculation (min) 43 min    Equipment Utilized During Treatment Gait belt    Activity Tolerance Patient tolerated treatment well    Behavior During Therapy [facility] for tasks assessed/performed           Past Medical History:  Diagnosis Date  . Abscess of parotid gland 01/2011   w/parotid stones  . 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 Depression   . Heartburn   . IBS (irritable bowel syndrome)   . Non Hodgkin's lymphoma ([place])   . Personal history of colonic polyps 04/13/2007   hyperplastic  . Positional vertigo   . Reflux   . Sjogren's syndrome [facility])     Past Surgical History:  Procedure Laterality Date  . APPENDECTOMY    . BREAST BIOPSY Right 1986   Benign   . CATARACT EXTRACTION  2008 &2010   bilateral  . iredectomy  2008   left  . LUNG REMOVAL, PARTIAL  04/1999   right lung  . OOPHORECTOMY  2000  . SALIVARY GLAND SURGERY  01/2011 & 04/2011  . TONSILLECTOMY AND ADENOIDECTOMY    . VEIN SURGERY  1966    There were no vitals filed for this visit.   Subjective Assessment - 05/09/20 1103    Subjective Reports she has been doing well, minimal to no symptoms with HEP. Did get sudden onset of mild dizziness while sitting in the lobby just now. She wonders if it was due to rushing to get here. No falls.    Pertinent History Sjogren's syndrome: chronic diastolic heart failure:  lymphoma; h/o abscess of parotid gland; IBS; depression; essential tremor; lymphoma; abnormal CAT scan    Patient Stated Goals "Resolve the vertigo or make it not quite so prominent"    Currently in Pain? No/denies              [facility] Adult PT Treatment/Exercise - 05/09/20 1105      Transfers   Transfers Sit to Stand;Stand to Sit    Sit to Stand 6: Modified independent (Device/Increase time)    Stand to Sit 6: Modified independent (Device/Increase time)      Ambulation/Gait   Ambulation/Gait Yes    Ambulation/Gait Assistance 5: Supervision    Ambulation/Gait Assistance Details around gym with session    Assistive device None    Gait Pattern Step-through pattern;Decreased stride length;Trunk flexed    Ambulation Surface Level;Indoor      Neuro Re-ed    Neuro Re-ed Details  for balance/habituation: gait along hallways with head movements left<>fwd<>right, then up<>fwd<>down for 2 laps each. time needed with gaze stabilization (1-2 minutes) to allow for decrease in symptoms (incr to 3-4/10 dizziness after each lap). min guard assist for balance;  in corner: on floor- cross body reaching to wall, alternating sides for 5 reps  each side with rest for gaze stabilization after each reach to the wall.Pt reported overall decrease at end of this task. Did report increase in symptoms with initial reps that decreased in intensity as reps progressed.  standing on floor next to mat table- tracking ball movements with head/eyes for vertical, lateral, circles both ways, "T" and "X"  For 5 reps each. Mild increase in symptoms after each movement that decreased with rest and gaze stabilization. Min guard assist for balance.                   PT Short Term Goals - 04/30/20 1737      PT SHORT TERM GOAL #1   Title Complete balance assessments including DGI and TUG after Lt BPPV is resolved.    Baseline 04/30/20: met today with TUG 11.65 sec's no AD, DGI score 17/24    Status Achieved    Target Date  05/10/20      PT SHORT TERM GOAL #2   Title Pt will be independent in HEP for balance and habituation exs.    Time 3    Period Weeks    Status On-going    Target Date 05/10/20             PT Long Term Goals - 04/30/20 1738      PT LONG TERM GOAL #1   Title Pt will be independent in updated HEP as appropriate. (all LTGs due 05/30/20)    Time 7    Period Weeks    Status On-going      PT LONG TERM GOAL #2   Title Pt will improve DGI score by at least 4 points to demo increased safety with amb.    Baseline 04/30/20: baseline 17/24    Time 7    Period Weeks    Status On-going      PT LONG TERM GOAL #3   Title Pt will have a (-) Lt Dix-Hallpike test to indicate resolution of Lt BPPV.    Time 7    Period Weeks    Status On-going      PT LONG TERM GOAL #4   Title Improve DHI score from 36% to </= 26% to demo improvement in vertigo.    Baseline 36 on 04-16-20    Time 7    Period Weeks    Status On-going                 Plan - 05/09/20 1105    Clinical Impression Statement Today's skilled session continued to focus on tasks for increased visual and vestibular imput with rest breaks needed to allow symptoms to subside to baseline levels. No other issues reported or noted in session. The pt is progressing and should benefit from continued PT to progress toward unmet goals.    Personal Factors and Comorbidities Behavior Pattern;Comorbidity 2;Past/Current Experience    Comorbidities Lymphoma, depression, h/o abscess of parotid gland, chronic diastolic heart failure, IBS, non-Hodgkins lymphoma, Sjogren's syndrome    Examination-Activity Limitations Transfers;Bend;Stairs;Reach Overhead;Locomotion Level;Squat    Examination-Participation Restrictions Cleaning;Shop;Community Activity;Laundry;Meal Prep    Stability/Clinical Decision Making Evolving/Moderate complexity    Rehab Potential Good    PT Frequency 1x / week    PT Duration 6 weeks   + eval for 7 visits total   PT  Treatment/Interventions ADLs/Self Care Home Management;Canalith Repostioning;Gait training;Stair training;Therapeutic activities;Therapeutic exercise;Balance training;Neuromuscular re-education;Patient/family education;Vestibular    PT Next Visit Plan continue to work on habituation ex's and balance with vision removed  progressing to complaint surfaces as able.    PT Home Exercise Plan Access Code: [email or web address]    [email or web address] and Agree with Plan of Care Patient           Patient will benefit from skilled therapeutic intervention in order to improve the following deficits and impairments:  Difficulty walking, Decreased balance, Dizziness  Visit Diagnosis: Unsteadiness on feet  Difficulty in walking, not elsewhere classified     Problem List Patient Active Problem List   Diagnosis Date Noted  . Chronic parotitis 11/29/2015  . Parotid sialolithiasis 11/29/2015  . Rhinitis, chronic 11/29/2015  . Arthritis 04/25/2015  . 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 History of biliary T-tube placement 04/25/2015  . Non Hodgkin's lymphoma ([place]) 04/25/2015  . Ocular rosacea 04/25/2015  . Chronic diastolic heart failure ([place]) 04/25/2014  . Essential hypertension 04/25/2014  . Hypertriglyceridemia 04/25/2014  . Positional vertigo   . Sjogren's disease ([place])   . IBS (irritable bowel syndrome)   . Reflux   . Depression   . Abnormal CAT scan 01/17/2013  . Chronic cough 01/17/2013  . 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 Marginal zone lymphoma 07/17/2011  . Abscess of parotid gland 01/30/2011  . Personal history of colonic polyps 04/13/2007    Willow Ora, PTA, [facility] Outpatient Neuro [facility] [address] [place],  [address] [phone] 05/10/20, 4:27 PM   Name: Nicole [name] MRN: [id number] Date of Birth: Jan 20, 1933

## 2020-05-14 ENCOUNTER — Ambulatory Visit: Payer: Medicare Other | Admitting: Physical Therapy

## 2020-05-14 ENCOUNTER — Other Ambulatory Visit: Payer: Self-pay

## 2020-05-14 DIAGNOSIS — R2681 Unsteadiness on feet: Secondary | ICD-10-CM

## 2020-05-14 DIAGNOSIS — R262 Difficulty in walking, not elsewhere classified: Secondary | ICD-10-CM

## 2020-05-14 NOTE — Patient Instructions (Addendum)
Feet Together (Compliant Surface) Varied Arm Positions - Eyes Open -- NO PILLOW!!!   With eyes open, standing on compliant surface: _pillows_______, feet together and arms out, look at a stationary object. Hold _30___ seconds. Repeat _2__ times per session. Do __1-2__ sessions per day.  DO SLOW head turns 5 times side to side   TRY to increase letter exercise to 60 secs for side to side and up/down  Copyright  VHI. All rights reserved.

## 2020-05-15 ENCOUNTER — Encounter: Payer: Self-pay | Admitting: Physical Therapy

## 2020-05-15 NOTE — Therapy (Signed)
[facility] [address] [place], [place], [address] Phone: [phone]   Fax:  [phone]  Physical Therapy Treatment  Patient Details  Name: Nicole [name] MRN: [id number] Date of Birth: Jul 30, 1933 Referring Provider (PT): Dr. Donald Prose   Encounter Date: 05/14/2020   PT End of Session - 05/15/20 1727    Visit Number 5    Number of Visits 7   eval + 6 visits   Date for PT Re-Evaluation 05/30/20    Authorization Type UHC Medicare    Authorization Time Period 04-16-20 - 06-16-20    PT Start Time 1101    PT Stop Time 1145    PT Time Calculation (min) 44 min    Equipment Utilized During Treatment Gait belt    Activity Tolerance Patient tolerated treatment well    Behavior During Therapy [facility] for tasks assessed/performed           Past Medical History:  Diagnosis Date  . Abscess of parotid gland 01/2011   w/parotid stones  . 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 Depression   . Heartburn   . IBS (irritable bowel syndrome)   . Non Hodgkin's lymphoma ([place])   . Personal history of colonic polyps 04/13/2007   hyperplastic  . Positional vertigo   . Reflux   . Sjogren's syndrome [facility] 2)     Past Surgical History:  Procedure Laterality Date  . APPENDECTOMY    . BREAST BIOPSY Right 1986   Benign   . CATARACT EXTRACTION  2008 &2010   bilateral  . iredectomy  2008   left  . LUNG REMOVAL, PARTIAL  04/1999   right lung  . OOPHORECTOMY  2000  . SALIVARY GLAND SURGERY  01/2011 & 04/2011  . TONSILLECTOMY AND ADENOIDECTOMY    . VEIN SURGERY  1966    There were no vitals filed for this visit.   Subjective Assessment - 05/14/20 1104    Subjective Pt states the dizziness has resolved at this time; is doing balance exercises at home    Pertinent History Sjogren's syndrome: chronic diastolic heart failure: lymphoma; h/o abscess of parotid gland; IBS; depression; essential tremor; lymphoma; abnormal CAT scan    Patient  Stated Goals "Resolve the vertigo or make it not quite so prominent"    Currently in Pain? No/denies                              Vestibular Treatment/Exercise - 05/15/20 0001      Vestibular Treatment/Exercise   Gaze Exercises X1 Viewing Horizontal;X1 Viewing Vertical      X1 Viewing Horizontal   Foot Position bil. stance    Time --   60 secs   Reps 1    Comments cues for correct technique but no incr. symptoms reported upon completion of exercise (60 secs) plain background used      X1 Viewing Vertical   Foot Position bil. stance    Time --   60 secs    Reps 1    Comments plain background - pt in standing position              Balance Exercises - 05/15/20 0001      Balance Exercises: Standing   Standing Eyes Opened Wide (BOA);Head turns;Foam/compliant surface;5 reps    Standing Eyes Closed Wide (BOA);Head turns;5 reps;Foam/compliant surface    Rockerboard Anterior/posterior;EO;EC;10 reps;Head turns    Gait with Head  Turns Forward;2 reps   25' x 2 reps - horizontal head turns   Turning Right;Left;Other reps (comment)   2 reps to each side   Marching Foam/compliant surface;Static;Head turns;Intermittent upper extremity assist;5 reps    Other Standing Exercises sit to stand on Airex 5 reps no UE support     Other Standing Exercises Comments pt performed standing with feet together on floor - with head turns horizontal and vertical head turns 5 reps each             PT Education - 05/15/20 1726    Education Details added standing feet together on floor with head turns; also increased x1 viewing ex. to 60 secs for HEP    Person(s) Educated Patient    Methods Explanation;Demonstration;Handout    Comprehension Verbalized understanding;Returned demonstration            PT Short Term Goals - 05/14/20 1120      PT SHORT TERM GOAL #1   Title Complete balance assessments including DGI and TUG after Lt BPPV is resolved.    Baseline 04/30/20: met  today with TUG 11.65 sec's no AD, DGI score 17/24    Status Achieved    Target Date 05/10/20      PT SHORT TERM GOAL #2   Title Pt will be independent in HEP for balance and habituation exs.    Time 3    Period Weeks    Status On-going    Target Date 05/10/20             PT Long Term Goals - 05/14/20 1120      PT LONG TERM GOAL #1   Title Pt will be independent in updated HEP as appropriate. (all LTGs due 05/30/20)    Time 7    Period Weeks    Status On-going      PT LONG TERM GOAL #2   Title Pt will improve DGI score by at least 4 points to demo increased safety with amb.    Baseline 04/30/20: baseline 17/24    Time 7    Period Weeks    Status On-going      PT LONG TERM GOAL #3   Title Pt will have a (-) Lt Dix-Hallpike test to indicate resolution of Lt BPPV.    Time 7    Period Weeks    Status On-going      PT LONG TERM GOAL #4   Title Improve DHI score from 36% to </= 26% to demo improvement in vertigo.    Baseline 36 on 04-16-20    Time 7    Period Weeks    Status On-going                 Plan - 05/14/20 1101    Clinical Impression Statement Pt continues to progress well towards goals; continues to c/o incr."dizziness" ("disorientation") with vestibular activities and exercises on compliant surfaces with EC, indicative of decr. vestibular input in maintaining balance.  Pt cont. to report vertigo remains resolved.    Personal Factors and Comorbidities Behavior Pattern;Comorbidity 2;Past/Current Experience    Comorbidities Lymphoma, depression, h/o abscess of parotid gland, chronic diastolic heart failure, IBS, non-Hodgkins lymphoma, Sjogren's syndrome    Examination-Activity Limitations Transfers;Bend;Stairs;Reach Overhead;Locomotion Level;Squat    Examination-Participation Restrictions Cleaning;Shop;Community Activity;Laundry;Meal Prep    Stability/Clinical Decision Making Evolving/Moderate complexity    Rehab Potential Good    PT Frequency 1x / week     PT Duration 6 weeks   +  eval for 7 visits total   PT Treatment/Interventions ADLs/Self Care Home Management;Canalith Repostioning;Gait training;Stair training;Therapeutic activities;Therapeutic exercise;Balance training;Neuromuscular re-education;Patient/family education;Vestibular    PT Next Visit Plan begin checking STG's - plan D/C in next 2 sessions -continue to work on habituation ex's and balance with vision removed progressing to complaint surfaces as able.    PT Home Exercise Plan Access Code: [email or web address]    [email or web address] and Agree with Plan of Care Patient           Patient will benefit from skilled therapeutic intervention in order to improve the following deficits and impairments:  Difficulty walking, Decreased balance, Dizziness  Visit Diagnosis: Unsteadiness on feet  Difficulty in walking, not elsewhere classified     Problem List Patient Active Problem List   Diagnosis Date Noted  . Chronic parotitis 11/29/2015  . Parotid sialolithiasis 11/29/2015  . Rhinitis, chronic 11/29/2015  . Arthritis 04/25/2015  . 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 History of biliary T-tube placement 04/25/2015  . Non Hodgkin's lymphoma ([place]) 04/25/2015  . Ocular rosacea 04/25/2015  . Chronic diastolic heart failure ([place]) 04/25/2014  . Essential hypertension 04/25/2014  . Hypertriglyceridemia 04/25/2014  . Positional vertigo   . Sjogren's disease ([place])   . IBS (irritable bowel syndrome)   . Reflux   . Depression   . Abnormal CAT scan 01/17/2013  . Chronic cough 01/17/2013  . 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 Marginal zone lymphoma 07/17/2011  . Abscess of parotid gland 01/30/2011  . Personal history of colonic polyps 04/13/2007    Alda Lea, PT 05/15/2020, 6:01 PM  [facility] [address] [place], [place], [address] Phone: [phone]   Fax:  [phone]  Name: CESILIA SHINN MRN: [id number] Date of  Birth: 08-28-1933

## 2020-05-20 ENCOUNTER — Other Ambulatory Visit: Payer: Self-pay | Admitting: Cardiology

## 2020-05-23 ENCOUNTER — Encounter: Payer: Medicare Other | Admitting: Physical Therapy

## 2020-05-28 ENCOUNTER — Encounter: Payer: Medicare Other | Admitting: Physical Therapy

## 2020-05-30 ENCOUNTER — Encounter: Payer: Self-pay | Admitting: Physical Therapy

## 2020-05-30 ENCOUNTER — Ambulatory Visit: Payer: Medicare Other | Admitting: Physical Therapy

## 2020-05-30 ENCOUNTER — Other Ambulatory Visit: Payer: Self-pay

## 2020-05-30 DIAGNOSIS — R262 Difficulty in walking, not elsewhere classified: Secondary | ICD-10-CM

## 2020-05-30 DIAGNOSIS — R2681 Unsteadiness on feet: Secondary | ICD-10-CM | POA: Diagnosis not present

## 2020-05-30 NOTE — Therapy (Signed)
[facility] [address] [place], [place], [address] Phone: [phone]   Fax:  [phone]  Physical Therapy Treatment  Patient Details  Name: Nicole [name] MRN: [id number] Date of Birth: 07/20/1933 Referring Provider (PT): Dr. Donald Prose   Encounter Date: 05/30/2020   PT End of Session - 05/30/20 1406    Visit Number 6    Number of Visits 7   eval + 6 visits   Date for PT Re-Evaluation 05/30/20    Authorization Type U[employer] Medicare    Authorization Time Period 04-16-20 - 06-16-20    PT Start Time 1403    PT Stop Time 1444    PT Time Calculation (min) 41 min    Equipment Utilized During Treatment Gait belt    Activity Tolerance Patient tolerated treatment well    Behavior During Therapy [facility] for tasks assessed/performed           Past Medical History:  Diagnosis Date  . Abscess of parotid gland 01/2011   w/parotid stones  . 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 Depression   . Heartburn   . IBS (irritable bowel syndrome)   . Non Hodgkin's lymphoma ([place])   . Personal history of colonic polyps 04/13/2007   hyperplastic  . Positional vertigo   . Reflux   . Sjogren's syndrome [facility])     Past Surgical History:  Procedure Laterality Date  . APPENDECTOMY    . BREAST BIOPSY Right 1986   Benign   . CATARACT EXTRACTION  2008 &2010   bilateral  . iredectomy  2008   left  . LUNG REMOVAL, PARTIAL  04/1999   right lung  . OOPHORECTOMY  2000  . SALIVARY GLAND SURGERY  01/2011 & 04/2011  . TONSILLECTOMY AND ADENOIDECTOMY    . VEIN SURGERY  1966    There were no vitals filed for this visit.   Subjective Assessment - 05/30/20 1404    Subjective Was in [place] with family, so not able to do her ex's much. No dizziness, no longer taking Meclazine. Does still feel off balance.    Pertinent History Sjogren's syndrome: chronic diastolic heart failure: lymphoma; h/o abscess of parotid gland; IBS; depression; essential  tremor; lymphoma; abnormal CAT scan    Patient Stated Goals "Resolve the vertigo or make it not quite so prominent"    Currently in Pain? No/denies                [facility] Adult PT Treatment/Exercise - 05/30/20 1408      Transfers   Sit to Stand 6: Modified independent (Device/Increase time)    Stand to Sit 6: Modified independent (Device/Increase time)      Neuro Re-ed    Neuro Re-ed Details  for balance/habituation: gait around track- self tossing ball for 1 lap, passing the ball to the left/retrieving ball on the right, then moving ball in a "T" pattern. min guard assist with veering noted, no significant balance loss. min guard assist.                Balance Exercises - 05/30/20 1419      Balance Exercises: Standing   Partial Tandem Stance Eyes closed;Foam/compliant surface;3 reps;30 secs;Limitations    Partial Tandem Stance Limitations on airex in parallel bars with no UE support: performed with each foot forward 3 reps with up to min assist needed for balance due to increased postural sway.     Marching Foam/compliant surface;Head turns;Static;Limitations  Marching Limitations on blue mat with no UE support marching in place with slow head movements left<>right, then up<>down with min assist for balance needed.     Other Standing Exercises blue mat over ramp: performed facing both up, then down the ramp with feet hip width apart- EC 30 seconds for 3 reps, then EC for head movements left<>right, then up<>down for ~10 reps each with up to min assist needed for balance. increased postural sway noted with facing down ramp vs up ramp.                PT Short Term Goals - 05/14/20 1120      PT SHORT TERM GOAL #1   Title Complete balance assessments including DGI and TUG after Lt BPPV is resolved.    Baseline 04/30/20: met today with TUG 11.65 sec's no AD, DGI score 17/24    Status Achieved    Target Date 05/10/20      PT SHORT TERM GOAL #2   Title Pt will be  independent in HEP for balance and habituation exs.    Time 3    Period Weeks    Status On-going    Target Date 05/10/20             PT Long Term Goals - 05/30/20 1407      PT LONG TERM GOAL #1   Title Pt will be independent in updated HEP as appropriate. (all LTGs due 05/30/20)    Time 7    Period Weeks    Status On-going      PT LONG TERM GOAL #2   Title Pt will improve DGI score by at least 4 points to demo increased safety with amb.    Baseline 04/30/20: baseline 17/24    Time 7    Period Weeks    Status On-going      PT LONG TERM GOAL #3   Title Pt will have a (-) Lt Dix-Hallpike test to indicate resolution of Lt BPPV.    Baseline 05/30/20: pt reports vertigo is resolved    Status Deferred      PT LONG TERM GOAL #4   Title Improve DHI score from 36% to </= 26% to demo improvement in vertigo.    Baseline 36 on 04-16-20    Time 7    Period Weeks    Status On-going                 Plan - 05/30/20 1406    Clinical Impression Statement Today's skilled session continued to focus on balance with increased vestibular system imput with no dizziness reported, only feeling unsteady. Rest breaks taken as needed. Primary PT plans to discharge at next visit.    Personal Factors and Comorbidities Behavior Pattern;Comorbidity 2;Past/Current Experience    Comorbidities Lymphoma, depression, h/o abscess of parotid gland, chronic diastolic heart failure, IBS, non-Hodgkins lymphoma, Sjogren's syndrome    Examination-Activity Limitations Transfers;Bend;Stairs;Reach Overhead;Locomotion Level;Squat    Examination-Participation Restrictions Cleaning;Shop;Community Activity;Laundry;Meal Prep    Stability/Clinical Decision Making Evolving/Moderate complexity    Rehab Potential Good    PT Frequency 1x / week    PT Duration 6 weeks   + eval for 7 visits total   PT Treatment/Interventions ADLs/Self Care Home Management;Canalith Repostioning;Gait training;Stair training;Therapeutic  activities;Therapeutic exercise;Balance training;Neuromuscular re-education;Patient/family education;Vestibular    PT Next Visit Plan plan D/C session next visit -check goals and have pt complete discharge FOTO.    PT Home Exercise Plan Access Code: [email or web address]  Consulted and Agree with Plan of Care Patient           Patient will benefit from skilled therapeutic intervention in order to improve the following deficits and impairments:  Difficulty walking, Decreased balance, Dizziness  Visit Diagnosis: Unsteadiness on feet  Difficulty in walking, not elsewhere classified     Problem List Patient Active Problem List   Diagnosis Date Noted  . Chronic parotitis 11/29/2015  . Parotid sialolithiasis 11/29/2015  . Rhinitis, chronic 11/29/2015  . Arthritis 04/25/2015  . 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 History of biliary T-tube placement 04/25/2015  . Non Hodgkin's lymphoma ([place]) 04/25/2015  . Ocular rosacea 04/25/2015  . Chronic diastolic heart failure ([place]) 04/25/2014  . Essential hypertension 04/25/2014  . Hypertriglyceridemia 04/25/2014  . Positional vertigo   . Sjogren's disease ([place])   . IBS (irritable bowel syndrome)   . Reflux   . Depression   . Abnormal CAT scan 01/17/2013  . Chronic cough 01/17/2013  . 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 Marginal zone lymphoma 07/17/2011  . Abscess of parotid gland 01/30/2011  . Personal history of colonic polyps 04/13/2007    Willow Ora, PTA, [facility] [place] [facility] [address] [place], [place] [address] [phone] 05/30/20, 4:46 PM   Name: ZENORA KARPEL MRN: [id number] Date of Birth: Feb 19, 1933

## 2020-06-04 ENCOUNTER — Telehealth: Payer: Self-pay | Admitting: *Deleted

## 2020-06-04 ENCOUNTER — Ambulatory Visit: Payer: Medicare Other | Attending: Internal Medicine

## 2020-06-04 ENCOUNTER — Ambulatory Visit: Payer: Medicare Other | Attending: Family Medicine | Admitting: Physical Therapy

## 2020-06-04 ENCOUNTER — Other Ambulatory Visit: Payer: Self-pay

## 2020-06-04 DIAGNOSIS — H8111 Benign paroxysmal vertigo, right ear: Secondary | ICD-10-CM | POA: Diagnosis not present

## 2020-06-04 DIAGNOSIS — R2681 Unsteadiness on feet: Secondary | ICD-10-CM | POA: Diagnosis present

## 2020-06-04 DIAGNOSIS — Z23 Encounter for immunization: Secondary | ICD-10-CM

## 2020-06-04 NOTE — Patient Instructions (Signed)
  Sit to Side-Lying    Sit on edge of bed. 1. Turn head 45 to right. 2. Maintain head position and lie down slowly on left side. Hold until symptoms subside. 3. Sit up slowly. Hold until symptoms subside. 4. Turn head 45 to left. 5. Maintain head position and lie down slowly on right side. Hold until symptoms subside. 6. Sit up slowly. Repeat sequence ___5_ times per session. Do __3-5__ sessions per day.  Copyright  VHI. All rights reserved.     Rolling    With pillow under head, start on back. Roll slowly to right. Hold position until symptoms subside. Roll slowly onto left side. Hold position until symptoms subside. Repeat sequence _5___ times per session. Do __2-3__ sessions per day.  Copyright  VHI. All rights reserved.

## 2020-06-04 NOTE — Progress Notes (Signed)
   Covid-19 Vaccination Clinic  Name:  [name]    MRN: [id number] DOB: Dec 03, 1932  06/04/2020  [name] was observed post Covid-19 immunization for 15 minutes without incident. She was provided with Vaccine Information Sheet and instruction to access the V-Safe system.   [name] was instructed to call 911 with any severe reactions post vaccine: Marland Kitchen Difficulty breathing  . Swelling of face and throat  . A fast heartbeat  . A bad rash all over body  . Dizziness and weakness

## 2020-06-04 NOTE — Telephone Encounter (Signed)
Patient is scheduled for a booster shot today and just found out she was exposed to someone who has tested positive for COVID would like to know if she should keep booster appointment or reschedule.

## 2020-06-04 NOTE — Telephone Encounter (Signed)
Returned call to pt.  Stated she went ahead and got her booster vaccine, since she wasn't having any symptoms.  Stated she spoke with "different nurses" that told her to go ahead and get the vaccine. Pt. Reported she was indirectly exposed to [place] on 10/1, by way of a close friend that had been directly exposed to someone with [place].  Pt. Was advise to monitor for any symptoms of fever, chills, cough, shortness of breath, headache, or body aches.  Further advised that if symptoms develop, she should get tested for COVID.  Verb. Understanding.

## 2020-06-05 NOTE — Therapy (Signed)
[facility] [address] [place], [place], [address] Phone: [phone]   Fax:  [phone]  Physical Therapy Treatment  Patient Details  Name: Nicole [name] MRN: [id number] Date of Birth: 29-Sep-1932 Referring Provider (PT): Dr. Donald Prose   Encounter Date: 06/04/2020   PT End of Session - 06/05/20 1440    Visit Number 7    Number of Visits 7   eval + 6 visits   Date for PT Re-Evaluation 05/30/20    Authorization Type UHC Medicare    Authorization Time Period 04-16-20 - 06-16-20    PT Start Time 1018    PT Stop Time 1100    PT Time Calculation (min) 42 min    Equipment Utilized During Treatment Gait belt    Activity Tolerance Patient tolerated treatment well    Behavior During Therapy [facility] for tasks assessed/performed           Past Medical History:  Diagnosis Date  . Abscess of parotid gland 01/2011   w/parotid stones  . 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 Depression   . Heartburn   . IBS (irritable bowel syndrome)   . Non Hodgkin's lymphoma ([place])   . Personal history of colonic polyps 04/13/2007   hyperplastic  . Positional vertigo   . Reflux   . Sjogren's syndrome [facility])     Past Surgical History:  Procedure Laterality Date  . APPENDECTOMY    . BREAST BIOPSY Right 1986   Benign   . CATARACT EXTRACTION  2008 &2010   bilateral  . iredectomy  2008   left  . LUNG REMOVAL, PARTIAL  04/1999   right lung  . OOPHORECTOMY  2000  . SALIVARY GLAND SURGERY  01/2011 & 04/2011  . TONSILLECTOMY AND ADENOIDECTOMY    . VEIN SURGERY  1966    There were no vitals filed for this visit.   Subjective Assessment - 06/04/20 1018    Subjective Pt states she woke up on Friday am with re-occurrence of vertigo when she turned from her left side over onto her back; didn't think to do the sidelying exercises, but did the "eye exercise"; states she took 1/2 tablet Meclizine on Friday and Saturday, did not take on Sunday as  she was doing a little bit better; states she has a hint of vertigo at this time - has to be extra cautious; "has been disappointing for me"    Pertinent History Sjogren's syndrome: chronic diastolic heart failure: lymphoma; h/o abscess of parotid gland; IBS; depression; essential tremor; lymphoma; abnormal CAT scan    Patient Stated Goals "Resolve the vertigo or make it not quite so prominent"    Currently in Pain? No/denies                   Vestibular Assessment - 06/05/20 0001      Positional Testing   Dix-Hallpike Dix-Hallpike Right;Dix-Hallpike Left    Sidelying Test Sidelying Right;Sidelying Left    Horizontal Canal Testing Horizontal Canal Right;Horizontal Canal Left      Dix-Hallpike Right   Dix-Hallpike Right Duration approx. 10 secs     Dix-Hallpike Right Symptoms Upbeat, right rotatory nystagmus      Dix-Hallpike Left   Dix-Hallpike Left Duration none    Dix-Hallpike Left Symptoms No nystagmus      Sidelying Right   Sidelying Right Duration approx. 5 secs       Sidelying Left   Sidelying Left Duration approx. 3 secs  Sidelying Left Symptoms No nystagmus      Horizontal Canal Right   Horizontal Canal Right Duration approx. 12 secs    Horizontal Canal Right Symptoms Geotrophic      Horizontal Canal Left   Horizontal Canal Left Duration none      Horizontal Canal Right Intensity   Horizontal Canal Right Intensity Moderate    Right Intensity Comment pt reported nausea                      Vestibular Treatment/Exercise - 06/05/20 0001      Vestibular Treatment/Exercise   Vestibular Treatment Provided Canalith Repositioning    Canalith Repositioning Epley Manuever Right;Canal Roll Right       EPLEY MANUEVER RIGHT   Number of Reps  1      Canal Roll Right   Number of Reps  2    Overall Response  Improved Symptoms    Response Details  pt had Rt geotropic horizontal nystagmus            Instructed pt in Brandt-Daroff exercises for  Rt BPPV posterior canal and in repetitive rolling for Rt horizontal canal        PT Short Term Goals - 06/05/20 1445      PT SHORT TERM GOAL #1   Title Complete balance assessments including DGI and TUG after Lt BPPV is resolved.    Baseline 04/30/20: met today with TUG 11.65 sec's no AD, DGI score 17/24    Status Achieved    Target Date 05/10/20      PT SHORT TERM GOAL #2   Title Pt will be independent in HEP for balance and habituation exs.    Time 3    Period Weeks    Status On-going    Target Date 05/10/20             PT Long Term Goals - 06/05/20 1445      PT LONG TERM GOAL #1   Title Pt will be independent in updated HEP as appropriate. (all LTGs due 05/30/20)    Time 7    Period Weeks    Status On-going      PT LONG TERM GOAL #2   Title Pt will improve DGI score by at least 4 points to demo increased safety with amb.    Baseline 04/30/20: baseline 17/24    Time 7    Period Weeks    Status On-going      PT LONG TERM GOAL #3   Title Pt will have a (-) Lt Dix-Hallpike test to indicate resolution of Lt BPPV.    Baseline 05/30/20: pt reports vertigo is resolved    Status Deferred      PT LONG TERM GOAL #4   Title Improve DHI score from 36% to </= 26% to demo improvement in vertigo.    Baseline 36 on 04-16-20    Time 7    Period Weeks    Status On-going      PT LONG TERM GOAL #5   Title Pt will have (-) Rt Dix-Hallpike and (-) Rt horizontal roll test to indicate resolution of Rt BPPV.    Time 4    Period Weeks    Status New    Target Date 07/05/20                 Plan - 06/05/20 1442    Clinical Impression Statement Pt has reoccurrence of vertigo due to Rt BPPV (  multi canal involvement) with Rt rotary and Rt horizontal nystagmus noted, indicative of Rt posterior and Rt horizontal canalithiasis.  Pt was treated with 1 rep of Epley and 2 reps of Bar-B-que roll for posterior and horizontal canalithiasis, respectively.  Symptoms appeared to be improved  on 2nd rep of Bar-B-Que roll.    Personal Factors and Comorbidities Behavior Pattern;Comorbidity 2;Past/Current Experience    Comorbidities Lymphoma, depression, h/o abscess of parotid gland, chronic diastolic heart failure, IBS, non-Hodgkins lymphoma, Sjogren's syndrome    Examination-Activity Limitations Transfers;Bend;Stairs;Reach Overhead;Locomotion Level;Squat    Examination-Participation Restrictions Cleaning;Shop;Community Activity;Laundry;Meal Prep    Stability/Clinical Decision Making Evolving/Moderate complexity    Rehab Potential Good    PT Frequency 1x / week    PT Duration 6 weeks   + eval for 7 visits total   PT Treatment/Interventions ADLs/Self Care Home Management;Canalith Repostioning;Gait training;Stair training;Therapeutic activities;Therapeutic exercise;Balance training;Neuromuscular re-education;Patient/family education;Vestibular    PT Next Visit Plan recheck Rt BPPV - posterior and horizontal canal -D/C if resolved    PT Home Exercise Plan Access Code: [email or web address]    Consulted and Agree with Plan of Care Patient           Patient will benefit from skilled therapeutic intervention in order to improve the following deficits and impairments:  Difficulty walking, Decreased balance, Dizziness  Visit Diagnosis: BPPV (benign paroxysmal positional vertigo), right - Plan: PT plan of care cert/re-cert     Problem List Patient Active Problem List   Diagnosis Date Noted  . Chronic parotitis 11/29/2015  . Parotid sialolithiasis 11/29/2015  . Rhinitis, chronic 11/29/2015  . Arthritis 04/25/2015  . 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 History of biliary T-tube placement 04/25/2015  . Non Hodgkin's lymphoma ([place]) 04/25/2015  . Ocular rosacea 04/25/2015  . Chronic diastolic heart failure ([place]) 04/25/2014  . Essential hypertension 04/25/2014  . Hypertriglyceridemia 04/25/2014  . Positional vertigo   . Sjogren's disease ([place])   . IBS (irritable bowel syndrome)   . Reflux    . Depression   . Abnormal CAT scan 01/17/2013  . Chronic cough 01/17/2013  . 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 Marginal zone lymphoma 07/17/2011  . Abscess of parotid gland 01/30/2011  . Personal history of colonic polyps 04/13/2007    Nicole [name], PT 06/05/2020, 3:21 PM  [facility] [address] [place], [place], [address] Phone: [phone]   Fax:  [phone]  Name: Nicole [name] MRN: [id number] Date of Birth: 1933-03-03

## 2020-06-13 ENCOUNTER — Ambulatory Visit: Payer: Medicare Other | Admitting: Physical Therapy

## 2020-06-13 ENCOUNTER — Other Ambulatory Visit: Payer: Self-pay

## 2020-06-13 DIAGNOSIS — R2681 Unsteadiness on feet: Secondary | ICD-10-CM

## 2020-06-13 DIAGNOSIS — H8111 Benign paroxysmal vertigo, right ear: Secondary | ICD-10-CM

## 2020-06-14 ENCOUNTER — Encounter: Payer: Self-pay | Admitting: Physical Therapy

## 2020-06-14 NOTE — Therapy (Signed)
[facility] [address] [place], [place], [address] Phone: [phone]   Fax:  [phone]  Physical Therapy Treatment  Patient Details  Name: Nicole [name] MRN: [id number] Date of Birth: 1932/12/30 Referring Provider (PT): Dr. Donald Prose   Encounter Date: 06/13/2020   PT End of Session - 06/14/20 1809    Visit Number 8    Number of Visits 12   eval + 6 visits   Date for PT Re-Evaluation 06/28/20    Authorization Type UHC Medicare    Authorization Time Period 04-16-20 - 06-16-20    PT Start Time 1537    PT Stop Time 1620    PT Time Calculation (min) 43 min    Equipment Utilized During Treatment Gait belt    Activity Tolerance Patient tolerated treatment well    Behavior During Therapy [facility] for tasks assessed/performed           Past Medical History:  Diagnosis Date  . Abscess of parotid gland 01/2011   w/parotid stones  . 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 Depression   . Heartburn   . IBS (irritable bowel syndrome)   . Non Hodgkin's lymphoma ([place])   . Personal history of colonic polyps 04/13/2007   hyperplastic  . Positional vertigo   . Reflux   . Sjogren's syndrome [facility])     Past Surgical History:  Procedure Laterality Date  . APPENDECTOMY    . BREAST BIOPSY Right 1986   Benign   . CATARACT EXTRACTION  2008 &2010   bilateral  . iredectomy  2008   left  . LUNG REMOVAL, PARTIAL  04/1999   right lung  . OOPHORECTOMY  2000  . SALIVARY GLAND SURGERY  01/2011 & 04/2011  . TONSILLECTOMY AND ADENOIDECTOMY    . VEIN SURGERY  1966    There were no vitals filed for this visit.   Subjective Assessment - 06/14/20 1801    Subjective Pt states vertigo is a little better but not fully resolved    Pertinent History Sjogren's syndrome: chronic diastolic heart failure: lymphoma; h/o abscess of parotid gland; IBS; depression; essential tremor; lymphoma; abnormal CAT scan    Patient Stated Goals "Resolve the  vertigo or make it not quite so prominent"    Currently in Pain? No/denies                   Vestibular Assessment - 06/14/20 0001      Positional Testing   Dix-Hallpike Dix-Hallpike Right    Sidelying Test Sidelying Right;Sidelying Left      Dix-Hallpike Right   Dix-Hallpike Right Duration approx. 5 secs    Dix-Hallpike Right Symptoms Upbeat, right rotatory nystagmus      Sidelying Right   Sidelying Right Duration approx. 3 secs    Sidelying Right Symptoms No nystagmus                     Vestibular Treatment/Exercise - 06/14/20 0001      Vestibular Treatment/Exercise   Vestibular Treatment Provided Canalith Repositioning    Canalith Repositioning Epley Manuever Right       EPLEY MANUEVER RIGHT   Number of Reps  3    Overall Response Improved Symptoms    Response Details  mild dizziness with return to upright on each rep          Pt performed standing on foam in corner - EO and EC; pt stood with EC for 10  secs Pt performed head turns horizontal and vertical - EO 5 reps each direction:    EC - 5 reps horizontal and 5 reps vertical Head turns - no significant LOB occurred with these exercises        PT Education - 06/14/20 1806    Education Details Reviewed  Brandt-Daroff exercises for Rt BPPV with pt; added standing on FOAM in corner for balance (gave exercise from [place])    Person(s) Educated Patient    Methods Explanation;Demonstration;Handout   pt was previously given handout in prior sessions   Comprehension Verbalized understanding;Returned demonstration            PT Short Term Goals - 06/14/20 1816      PT SHORT TERM GOAL #1   Title Complete balance assessments including DGI and TUG after Lt BPPV is resolved.    Baseline 04/30/20: met today with TUG 11.65 sec's no AD, DGI score 17/24    Status Achieved    Target Date 05/10/20      PT SHORT TERM GOAL #2   Title Pt will be independent in HEP for balance and habituation exs.      Time 3    Period Weeks    Status On-going    Target Date 05/10/20             PT Long Term Goals - 06/14/20 1816      PT LONG TERM GOAL #1   Title Pt will be independent in updated HEP as appropriate. (all LTGs due 05/30/20)    Time 7    Period Weeks    Status On-going      PT LONG TERM GOAL #2   Title Pt will improve DGI score by at least 4 points to demo increased safety with amb.    Baseline 04/30/20: baseline 17/24    Time 7    Period Weeks    Status On-going      PT LONG TERM GOAL #3   Title Pt will have a (-) Lt Dix-Hallpike test to indicate resolution of Lt BPPV.    Baseline 05/30/20: pt reports vertigo is resolved    Status Deferred      PT LONG TERM GOAL #4   Title Improve DHI score from 36% to </= 26% to demo improvement in vertigo.    Baseline 36 on 04-16-20    Time 7    Period Weeks    Status On-going      PT LONG TERM GOAL #5   Title Pt will have (-) Rt Dix-Hallpike and (-) Rt horizontal roll test to indicate resolution of Rt BPPV.    Time 4    Period Weeks    Status New                 Plan - 06/14/20 1810    Clinical Impression Statement Rt BPPV posterior canalithiasis appeared to be mostly resolved on 3rd rep of Epley, with pt reporting mild light-headedness with Lt sidelying to sitting at end of maneuver.  Will continue to assess for resolution and initiate balance training as appropriate.    Personal Factors and Comorbidities Behavior Pattern;Comorbidity 2;Past/Current Experience    Comorbidities Lymphoma, depression, h/o abscess of parotid gland, chronic diastolic heart failure, IBS, non-Hodgkins lymphoma, Sjogren's syndrome    Examination-Activity Limitations Transfers;Bend;Stairs;Reach Overhead;Locomotion Level;Squat    Examination-Participation Restrictions Cleaning;Shop;Community Activity;Laundry;Meal Prep    Stability/Clinical Decision Making Evolving/Moderate complexity    Rehab Potential Good    PT Frequency 1x /  week    PT  Duration 4 weeks   + eval for 7 visits total   PT Treatment/Interventions ADLs/Self Care Home Management;Canalith Repostioning;Gait training;Stair training;Therapeutic activities;Therapeutic exercise;Balance training;Neuromuscular re-education;Patient/family education;Vestibular    PT Next Visit Plan recheck Rt BPPV - posterior and horizontal canal -D/C if resolved    PT Home Exercise Plan Access Code: [email or web address]    Consulted and Agree with Plan of Care Patient           Patient will benefit from skilled therapeutic intervention in order to improve the following deficits and impairments:  Difficulty walking, Decreased balance, Dizziness  Visit Diagnosis: BPPV (benign paroxysmal positional vertigo), right  Unsteadiness on feet     Problem List Patient Active Problem List   Diagnosis Date Noted  . Chronic parotitis 11/29/2015  . Parotid sialolithiasis 11/29/2015  . Rhinitis, chronic 11/29/2015  . Arthritis 04/25/2015  . 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 History of biliary T-tube placement 04/25/2015  . Non Hodgkin's lymphoma ([place]) 04/25/2015  . Ocular rosacea 04/25/2015  . Chronic diastolic heart failure ([place]) 04/25/2014  . Essential hypertension 04/25/2014  . Hypertriglyceridemia 04/25/2014  . Positional vertigo   . Sjogren's disease ([place])   . IBS (irritable bowel syndrome)   . Reflux   . Depression   . Abnormal CAT scan 01/17/2013  . Chronic cough 01/17/2013  . 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 Marginal zone lymphoma 07/17/2011  . Abscess of parotid gland 01/30/2011  . Personal history of colonic polyps 04/13/2007    Alda Lea, PT 06/14/2020, 6:17 PM  [facility] [address] [place], [place], [address] Phone: [phone]   Fax:  [phone]  Name: BILAN TEDESCO MRN: [id number] Date of Birth: Sep 22, 1932

## 2020-06-20 ENCOUNTER — Other Ambulatory Visit: Payer: Self-pay | Admitting: Family Medicine

## 2020-06-20 ENCOUNTER — Other Ambulatory Visit: Payer: Self-pay

## 2020-06-20 ENCOUNTER — Ambulatory Visit: Payer: Medicare Other | Admitting: Physical Therapy

## 2020-06-20 DIAGNOSIS — H8111 Benign paroxysmal vertigo, right ear: Secondary | ICD-10-CM | POA: Diagnosis not present

## 2020-06-20 DIAGNOSIS — H8110 Benign paroxysmal vertigo, unspecified ear: Secondary | ICD-10-CM

## 2020-06-21 ENCOUNTER — Encounter: Payer: Self-pay | Admitting: Physical Therapy

## 2020-06-21 NOTE — Therapy (Signed)
[facility] [address] [place], [place], [address] Phone: [phone]   Fax:  [phone]  Physical Therapy Treatment  Patient Details  Name: Nicole [name] MRN: [id number] Date of Birth: Feb 14, 1933 Referring Provider (PT): Dr. Donald Prose   Encounter Date: 06/20/2020   PT End of Session - 06/21/20 1536    Visit Number 9    Number of Visits 12   eval + 6 visits   Date for PT Re-Evaluation 06/28/20    Authorization Type UHC Medicare    Authorization Time Period 04-16-20 - 06-16-20    PT Start Time 1230    PT Stop Time 1315    PT Time Calculation (min) 45 min    Equipment Utilized During Treatment Gait belt    Activity Tolerance Patient tolerated treatment well    Behavior During Therapy [facility] for tasks assessed/performed           Past Medical History:  Diagnosis Date  . Abscess of parotid gland 01/2011   w/parotid stones  . 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 Depression   . Heartburn   . IBS (irritable bowel syndrome)   . Non Hodgkin's lymphoma ([place])   . Personal history of colonic polyps 04/13/2007   hyperplastic  . Positional vertigo   . Reflux   . Sjogren's syndrome [facility])     Past Surgical History:  Procedure Laterality Date  . APPENDECTOMY    . BREAST BIOPSY Right 1986   Benign   . CATARACT EXTRACTION  2008 &2010   bilateral  . iredectomy  2008   left  . LUNG REMOVAL, PARTIAL  04/1999   right lung  . OOPHORECTOMY  2000  . SALIVARY GLAND SURGERY  01/2011 & 04/2011  . TONSILLECTOMY AND ADENOIDECTOMY    . VEIN SURGERY  1966    There were no vitals filed for this visit.   Subjective Assessment - 06/20/20 1236    Subjective Pt states closing her eyes really gets her off balance - has not had much vertigo since last treatment session    Pertinent History Sjogren's syndrome: chronic diastolic heart failure: lymphoma; h/o abscess of parotid gland; IBS; depression; essential tremor; lymphoma;  abnormal CAT scan    Patient Stated Goals "Resolve the vertigo or make it not quite so prominent"    Currently in Pain? No/denies                   Vestibular Assessment - 06/21/20 0001      Positional Testing   Dix-Hallpike Dix-Hallpike Right    Sidelying Test Sidelying Right      Dix-Hallpike Right   Dix-Hallpike Right Duration approx. 7 secs    Dix-Hallpike Right Symptoms Upbeat, right rotatory nystagmus      Sidelying Right   Sidelying Right Duration approx. 15 secs    Sidelying Right Symptoms Right nystagmus      Horizontal Canal Right Intensity   Horizontal Canal Right Intensity Severe    Right Intensity Comment pt reported nausea             Epley maneuver for Rt BPPV - 2 reps performed - on 2nd rep pt had no nystagmus and no c/o vertigo  Rt posterior canalithiasis appeared to be resolved   Rt sidelying test was performed after Epley maneuver to assess for resolution of Rt BPPV - horizontal nystagmus occurred in Rt sidelying Position - ageotropic nystagmus occurred, indicative of Rt horizontal cupulolithiasis  Modifed Gufoni maneuver for Rt horizontal canal BPPV - 2 reps - pt became nauseous on 2nd rep so treatment was stopped Nystagmus occurred on both reps                 PT Education - 06/21/20 1535    Education Details educated pt in symptoms of horizontal canal BPPV    Person(s) Educated Patient    Methods Explanation    Comprehension Verbalized understanding            PT Short Term Goals - 06/21/20 1541      PT SHORT TERM GOAL #1   Title Complete balance assessments including DGI and TUG after Lt BPPV is resolved.    Baseline 04/30/20: met today with TUG 11.65 sec's no AD, DGI score 17/24    Status Achieved    Target Date 05/10/20      PT SHORT TERM GOAL #2   Title Pt will be independent in HEP for balance and habituation exs.    Time 3    Period Weeks    Status On-going    Target Date 05/10/20             PT  Long Term Goals - 06/21/20 1542      PT LONG TERM GOAL #1   Title Pt will be independent in updated HEP as appropriate. (all LTGs due 05/30/20)    Time 7    Period Weeks    Status On-going      PT LONG TERM GOAL #2   Title Pt will improve DGI score by at least 4 points to demo increased safety with amb.    Baseline 04/30/20: baseline 17/24    Time 7    Period Weeks    Status On-going      PT LONG TERM GOAL #3   Title Pt will have a (-) Lt Dix-Hallpike test to indicate resolution of Lt BPPV.    Baseline 05/30/20: pt reports vertigo is resolved    Status Deferred      PT LONG TERM GOAL #4   Title Improve DHI score from 36% to </= 26% to demo improvement in vertigo.    Baseline 36 on 04-16-20    Time 7    Period Weeks    Status On-going      PT LONG TERM GOAL #5   Title Pt will have (-) Rt Dix-Hallpike and (-) Rt horizontal roll test to indicate resolution of Rt BPPV.    Time 4    Period Weeks    Status New                 Plan - 06/20/20 1236    Clinical Impression Statement Pt has Rt ageotropic horizontal nystagmus in Rt sidelying position - indicative of Rt horizontal cupulolithiasis BPPV; pt was treated with modified Gufoni maneuver for horizontal canal BPPV- symptoms were persistent on 2nd rep, with minimal improvement, producing nausea and vertigo.  Pt requested to stop treatment after 2nd rep due to not feeling well.  Will cont to treat as pt able to tolerate.    Personal Factors and Comorbidities Behavior Pattern;Comorbidity 2;Past/Current Experience    Comorbidities Lymphoma, depression, h/o abscess of parotid gland, chronic diastolic heart failure, IBS, non-Hodgkins lymphoma, Sjogren's syndrome    Examination-Activity Limitations Transfers;Bend;Stairs;Reach Overhead;Locomotion Level;Squat    Examination-Participation Restrictions Cleaning;Shop;Community Activity;Laundry;Meal Prep    Stability/Clinical Decision Making Evolving/Moderate complexity    Rehab Potential  Good    PT Frequency 1x /  week    PT Duration 4 weeks   + eval for 7 visits total   PT Treatment/Interventions ADLs/Self Care Home Management;Canalith Repostioning;Gait training;Stair training;Therapeutic activities;Therapeutic exercise;Balance training;Neuromuscular re-education;Patient/family education;Vestibular    PT Next Visit Plan recheck Rt BPPV - posterior and horizontal canal -D/C if resolved    PT Home Exercise Plan Access Code: [email or web address]    Consulted and Agree with Plan of Care Patient           Patient will benefit from skilled therapeutic intervention in order to improve the following deficits and impairments:  Difficulty walking, Decreased balance, Dizziness  Visit Diagnosis: BPPV (benign paroxysmal positional vertigo), right     Problem List Patient Active Problem List   Diagnosis Date Noted  . Chronic parotitis 11/29/2015  . Parotid sialolithiasis 11/29/2015  . Rhinitis, chronic 11/29/2015  . Arthritis 04/25/2015  . 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 History of biliary T-tube placement 04/25/2015  . Non Hodgkin's lymphoma ([place]) 04/25/2015  . Ocular rosacea 04/25/2015  . Chronic diastolic heart failure ([place]) 04/25/2014  . Essential hypertension 04/25/2014  . Hypertriglyceridemia 04/25/2014  . Positional vertigo   . Sjogren's disease ([place])   . IBS (irritable bowel syndrome)   . Reflux   . Depression   . Abnormal CAT scan 01/17/2013  . Chronic cough 01/17/2013  . 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 Marginal zone lymphoma 07/17/2011  . Abscess of parotid gland 01/30/2011  . Personal history of colonic polyps 04/13/2007    Nicole [name], PT 06/21/2020, 3:44 PM  [facility] [address] [place], [place], [address] Phone: [phone]   Fax:  [phone]  Name: Nicole [name] MRN: [id number] Date of Birth: [birth year]

## 2020-06-24 ENCOUNTER — Other Ambulatory Visit: Payer: Self-pay

## 2020-06-24 ENCOUNTER — Telehealth: Payer: Self-pay | Admitting: Physical Therapy

## 2020-06-24 ENCOUNTER — Ambulatory Visit: Payer: Medicare Other | Admitting: Physical Therapy

## 2020-06-24 DIAGNOSIS — H8111 Benign paroxysmal vertigo, right ear: Secondary | ICD-10-CM

## 2020-06-24 NOTE — Telephone Encounter (Signed)
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  She is having much nausea with the treatment, so much that she is unable to tolerate further treatment for canalith repositioning at this time.  Would you please prescribe Zofran or another medication for nausea so that she could take the medication prior to appt. & tolerate the procedure without becoming sick.  Thank you so much, Guido Sander, PT

## 2020-06-25 NOTE — Therapy (Signed)
[facility] [address] [place], [place], [address] Phone: [phone]   Fax:  [phone]  Patient Details  Name: NADINA FOMBY MRN: [id number] Date of Birth: 01/13/1933 Referring Provider:  Harlan Stains, MD  Encounter Date: 06/24/2020   Pt reports she does not want to do anything that may provoke the vertigo and cause her to have nausea and become sick.  Pt inquires if there is anything to do for treatment that may not cause dizziness.  Pt was informed that there is not a treatment to resolve the BPPV other than canalith repositioning; pt was advised to contact her MD (telephone message was created by myself, PT)  but I was unable to send inbasket telephone message to Dr. Dema Severin as she is not in Epic system. Pt stated she would send message through her pt portal and request order for Zofran in order to tolerate treatment.  Alda Lea, PT 06/25/2020, 8:18 PM  [facility] [address] [place], [place], [address] Phone: [phone]   Fax:  [phone]

## 2020-07-02 ENCOUNTER — Ambulatory Visit: Payer: Medicare Other | Attending: Family Medicine | Admitting: Physical Therapy

## 2020-07-02 ENCOUNTER — Other Ambulatory Visit: Payer: Self-pay

## 2020-07-02 DIAGNOSIS — H8112 Benign paroxysmal vertigo, left ear: Secondary | ICD-10-CM | POA: Diagnosis not present

## 2020-07-02 DIAGNOSIS — R2681 Unsteadiness on feet: Secondary | ICD-10-CM | POA: Diagnosis present

## 2020-07-02 DIAGNOSIS — R262 Difficulty in walking, not elsewhere classified: Secondary | ICD-10-CM | POA: Insufficient documentation

## 2020-07-03 NOTE — Therapy (Signed)
[facility] [address] [place], [place], [address] Phone: [phone]   Fax:  [phone]  Physical Therapy Treatment & 10th visit progress note    Reporting period:  04-16-20 - 07-02-20 See below for progress towards goals  Patient Details  Name: Nicole [name] MRN: [id number] Date of Birth: 27-Jun-1933 Referring Provider (PT): Dr. Donald Prose   Encounter Date: 07/02/2020   PT End of Session - 07/03/20 2110    Visit Number 10    Number of Visits 12   eval + 6 visits   Date for PT Re-Evaluation 06/28/20    Authorization Type UHC Medicare    Authorization Time Period 04-16-20 - 06-16-20    PT Start Time 1450    PT Stop Time 1531    PT Time Calculation (min) 41 min    Equipment Utilized During Treatment --    Activity Tolerance Patient tolerated treatment well    Behavior During Therapy [facility] for tasks assessed/performed           Past Medical History:  Diagnosis Date  . Abscess of parotid gland 01/2011   w/parotid stones  . 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 Depression   . Heartburn   . IBS (irritable bowel syndrome)   . Non Hodgkin's lymphoma ([place])   . Personal history of colonic polyps 04/13/2007   hyperplastic  . Positional vertigo   . Reflux   . Sjogren's syndrome [facility])     Past Surgical History:  Procedure Laterality Date  . APPENDECTOMY    . BREAST BIOPSY Right 1986   Benign   . CATARACT EXTRACTION  2008 &2010   bilateral  . iredectomy  2008   left  . LUNG REMOVAL, PARTIAL  04/1999   right lung  . OOPHORECTOMY  2000  . SALIVARY GLAND SURGERY  01/2011 & 04/2011  . TONSILLECTOMY AND ADENOIDECTOMY    . VEIN SURGERY  1966    There were no vitals filed for this visit.   Subjective Assessment - 07/02/20 1452    Subjective Pt states she had dizziness the day after previous treatment session - pt states vertigo is better but  "I know that it's not quite right"    Pertinent History Sjogren's  syndrome: chronic diastolic heart failure: lymphoma; h/o abscess of parotid gland; IBS; depression; essential tremor; lymphoma; abnormal CAT scan    Patient Stated Goals "Resolve the vertigo or make it not quite so prominent"    Currently in Pain? No/denies                   Vestibular Assessment - 07/03/20 0001      Positional Testing   Dix-Hallpike Dix-Hallpike Right;Dix-Hallpike Left    Sidelying Test Sidelying Right;Sidelying Left      Dix-Hallpike Right   Dix-Hallpike Right Duration none    Dix-Hallpike Right Symptoms No nystagmus      Dix-Hallpike Left   Dix-Hallpike Left Duration approx. 5 secs    Dix-Hallpike Left Symptoms No nystagmus;Upbeat, left rotatory nystagmus      Sidelying Right   Sidelying Right Duration none      Sidelying Left   Sidelying Left Duration approx. 2 secs    Sidelying Left Symptoms No nystagmus      Horizontal Canal Right   Horizontal Canal Right Duration none    Horizontal Canal Right Symptoms Normal      Horizontal Canal Left   Horizontal Canal Left Duration none  Horizontal Canal Left Symptoms Normal                     Vestibular Treatment/Exercise - 07/03/20 0001      Vestibular Treatment/Exercise   Vestibular Treatment Provided Canalith Repositioning    Canalith Repositioning Epley Manuever Left       EPLEY MANUEVER LEFT   Number of Reps  3    Overall Response  Improved Symptoms     RESPONSE DETAILS LEFT BPPV appeared to be resolved on 3rd rep                   PT Short Term Goals - 07/03/20 2115      PT SHORT TERM GOAL #1   Title Complete balance assessments including DGI and TUG after Lt BPPV is resolved.    Baseline 04/30/20: met today with TUG 11.65 sec's no AD, DGI score 17/24    Status Achieved    Target Date 05/10/20      PT SHORT TERM GOAL #2   Title Pt will be independent in HEP for balance and habituation exs.    Time 3    Period Weeks    Status On-going    Target Date 05/10/20              PT Long Term Goals - 07/03/20 2116      PT LONG TERM GOAL #1   Title Pt will be independent in updated HEP as appropriate. (all LTGs due 05/30/20)    Time 7    Period Weeks    Status On-going      PT LONG TERM GOAL #2   Title Pt will improve DGI score by at least 4 points to demo increased safety with amb.    Baseline 04/30/20: baseline 17/24    Time 7    Period Weeks    Status On-going      PT LONG TERM GOAL #3   Title Pt will have a (-) Lt Dix-Hallpike test to indicate resolution of Lt BPPV.    Baseline 05/30/20: pt reports vertigo is resolved    Status Deferred      PT LONG TERM GOAL #4   Title Improve DHI score from 36% to </= 26% to demo improvement in vertigo.    Baseline 36 on 04-16-20    Time 7    Period Weeks    Status On-going      PT LONG TERM GOAL #5   Title Pt will have (-) Rt Dix-Hallpike and (-) Rt horizontal roll test to indicate resolution of Rt BPPV.    Time 4    Period Weeks    Status New                 Plan - 07/02/20 1452    Clinical Impression Statement This 10th visit progress note covers dates 04-16-20 - 07-02-20.  Pt had Rt horizontal canal BPPV which has resolved, but presents with (+) Lt Dix-hallpike test today, indicative of Lt BPPV posterior canalithiasis.  Pt was treated with 3 reps of Epley maneuver for Lt BPPV and symptoms appeared to be resolved on 3rd rep.    Personal Factors and Comorbidities Behavior Pattern;Comorbidity 2;Past/Current Experience    Comorbidities Lymphoma, depression, h/o abscess of parotid gland, chronic diastolic heart failure, IBS, non-Hodgkins lymphoma, Sjogren's syndrome    Examination-Activity Limitations Transfers;Bend;Stairs;Reach Overhead;Locomotion Level;Squat    Examination-Participation Restrictions Cleaning;Shop;Community Activity;Laundry;Meal Prep    Stability/Clinical Decision Making Evolving/Moderate complexity  Rehab Potential Good    PT Frequency 1x / week    PT Duration 4 weeks    + eval for 7 visits total   PT Treatment/Interventions ADLs/Self Care Home Management;Canalith Repostioning;Gait training;Stair training;Therapeutic activities;Therapeutic exercise;Balance training;Neuromuscular re-education;Patient/family education;Vestibular    PT Next Visit Plan recheck Lt BPPV - posterior and horizontal canal -D/C if resolved    PT Home Exercise Plan Access Code: [email or web address]    Consulted and Agree with Plan of Care Patient           Patient will benefit from skilled therapeutic intervention in order to improve the following deficits and impairments:  Difficulty walking, Decreased balance, Dizziness  Visit Diagnosis: BPPV (benign paroxysmal positional vertigo), left     Problem List Patient Active Problem List   Diagnosis Date Noted  . Chronic parotitis 11/29/2015  . Parotid sialolithiasis 11/29/2015  . Rhinitis, chronic 11/29/2015  . Arthritis 04/25/2015  . 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 History of biliary T-tube placement 04/25/2015  . Non Hodgkin's lymphoma ([place]) 04/25/2015  . Ocular rosacea 04/25/2015  . Chronic diastolic heart failure ([place]) 04/25/2014  . Essential hypertension 04/25/2014  . Hypertriglyceridemia 04/25/2014  . Positional vertigo   . Sjogren's disease ([place])   . IBS (irritable bowel syndrome)   . Reflux   . Depression   . Abnormal CAT scan 01/17/2013  . Chronic cough 01/17/2013  . 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 Marginal zone lymphoma 07/17/2011  . Abscess of parotid gland 01/30/2011  . Personal history of colonic polyps 04/13/2007    Alda Lea, PT 07/03/2020, 9:17 PM  [facility] [address] [place], [place], [address] Phone: [phone]   Fax:  [phone]  Name: Nicole [name] MRN: [id number] Date of Birth: [birth year]

## 2020-07-08 ENCOUNTER — Ambulatory Visit: Payer: Medicare Other | Admitting: Physical Therapy

## 2020-07-08 ENCOUNTER — Other Ambulatory Visit: Payer: Self-pay

## 2020-07-08 DIAGNOSIS — H8112 Benign paroxysmal vertigo, left ear: Secondary | ICD-10-CM | POA: Diagnosis not present

## 2020-07-08 DIAGNOSIS — R2681 Unsteadiness on feet: Secondary | ICD-10-CM

## 2020-07-09 ENCOUNTER — Encounter: Payer: Self-pay | Admitting: Physical Therapy

## 2020-07-09 NOTE — Therapy (Signed)
[facility] [address] [place], [place], [address] Phone: [phone]   Fax:  [phone]  Physical Therapy Treatment  Patient Details  Name: Nicole [name] MRN: [id number] Date of Birth: 1933/04/22 Referring Provider (PT): Dr. Donald Prose   Encounter Date: 07/08/2020   PT End of Session - 07/09/20 1955    Visit Number 11    Number of Visits 12   eval + 6 visits   Date for PT Re-Evaluation 07/19/20    Authorization Type [employer] Medicare    Authorization Time Period 04-16-20 - 06-16-20;  06-04-20 - 08-30-20    PT Start Time 1103    PT Stop Time 1149    PT Time Calculation (min) 46 min    Equipment Utilized During Treatment Gait belt    Activity Tolerance Patient tolerated treatment well    Behavior During Therapy [facility] for tasks assessed/performed           Past Medical History:  Diagnosis Date  . Abscess of parotid gland 01/2011   w/parotid stones  . 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 Depression   . Heartburn   . IBS (irritable bowel syndrome)   . Non Hodgkin's lymphoma ([place])   . Personal history of colonic polyps 04/13/2007   hyperplastic  . Positional vertigo   . Reflux   . Sjogren's syndrome [facility])     Past Surgical History:  Procedure Laterality Date  . APPENDECTOMY    . BREAST BIOPSY Right 1986   Benign   . CATARACT EXTRACTION  2008 &2010   bilateral  . iredectomy  2008   left  . LUNG REMOVAL, PARTIAL  04/1999   right lung  . OOPHORECTOMY  2000  . SALIVARY GLAND SURGERY  01/2011 & 04/2011  . TONSILLECTOMY AND ADENOIDECTOMY    . VEIN SURGERY  1966    There were no vitals filed for this visit.   Subjective Assessment - 07/09/20 1942    Subjective Pt reports she has not had any dizziness within past 2 weeks,  but feels that her balance is not quite as good as what it was prior to this most recent episode of BPPV in early Oct.    Pertinent History Sjogren's syndrome: chronic diastolic heart failure:  lymphoma; h/o abscess of parotid gland; IBS; depression; essential tremor; lymphoma; abnormal CAT scan    Patient Stated Goals "Resolve the vertigo or make it not quite so prominent"    Currently in Pain? No/denies                             [facility] Adult PT Treatment/Exercise - 07/09/20 0001      Transfers   Transfers Sit to Stand;Stand to Sit    Sit to Stand 5: Supervision    Number of Reps Other reps (comment)   5 reps   Comments no UE support used - feet on blue Airex                Balance Exercises - 07/09/20 0001      Balance Exercises: Standing   Standing Eyes Opened Narrow base of support (BOS);Wide (BOA);Head turns;Foam/compliant surface;5 reps   horizontal and vertical head turns   Standing Eyes Closed Wide (BOA);Head turns;Foam/compliant surface;5 reps   horizontal and vertical head turns   Rockerboard Anterior/posterior    Step Ups Forward;6 inch;Intermittent UE support   10 reps each leg    Partial  Tandem Stance Eyes open;Foam/compliant surface;2 reps   on blue mat - 10 sec hold each foot position   Marching Head turns;Static;Foam/compliant surface    Other Standing Exercises marching on incline/decline with EO and with EC; with horizontal and vertical head turns with EO and EC with CGA to min assist with recovery of LOB    Other Standing Exercises Comments pt performed amb. approx. 59' making circles with ball clockwise- tracking with eyes and head moving; counterclockwise 40' with tracking for improved gaze stabilization with gait                PT Short Term Goals - 07/09/20 2011      PT SHORT TERM GOAL #1   Title Complete balance assessments including DGI and TUG after Lt BPPV is resolved.    Baseline 04/30/20: met today with TUG 11.65 sec's no AD, DGI score 17/24    Status Achieved    Target Date 05/10/20      PT SHORT TERM GOAL #2   Title Pt will be independent in HEP for balance and habituation exs.    Time 3    Period Weeks     Status On-going    Target Date 05/10/20             PT Long Term Goals - 07/09/20 2011      PT LONG TERM GOAL #1   Title Pt will be independent in updated HEP as appropriate. (all LTGs due 05/30/20)    Time 7    Period Weeks    Status On-going      PT LONG TERM GOAL #2   Title Pt will improve DGI score by at least 4 points to demo increased safety with amb.    Baseline 04/30/20: baseline 17/24    Time 7    Period Weeks    Status On-going    Target Date 07/12/20      PT LONG TERM GOAL #3   Title Pt will have a (-) Lt Dix-Hallpike test to indicate resolution of Lt BPPV.    Baseline 05/30/20: pt reports vertigo is resolved    Status Achieved      PT LONG TERM GOAL #4   Title Improve DHI score from 36% to </= 26% to demo improvement in vertigo.    Baseline 36 on 04-16-20    Time 7    Period Weeks    Status On-going    Target Date 07/12/20      PT LONG TERM GOAL #5   Title Pt will have (-) Rt Dix-Hallpike and (-) Rt horizontal roll test to indicate resolution of Rt BPPV.    Baseline met 07-08-20    Time 4    Period Weeks    Status Achieved                 Plan - 07/09/20 2002    Clinical Impression Statement Pt has unsteadiness with standing on compliant surface with EC, indicative of vestibular hypofunction.  Pt's BPPV continues to be resolved at this time.  Basic balance skills and gait are WNL's.    Personal Factors and Comorbidities Behavior Pattern;Comorbidity 2;Past/Current Experience    Comorbidities Lymphoma, depression, h/o abscess of parotid gland, chronic diastolic heart failure, IBS, non-Hodgkins lymphoma, Sjogren's syndrome    Examination-Activity Limitations Transfers;Bend;Stairs;Reach Overhead;Locomotion Level;Squat    Examination-Participation Restrictions Cleaning;Shop;Community Activity;Laundry;Meal Prep    Stability/Clinical Decision Making Evolving/Moderate complexity    Rehab Potential Good    PT Frequency 1x /  week    PT Duration 6 weeks   +  eval for 7 visits total   PT Treatment/Interventions ADLs/Self Care Home Management;Canalith Repostioning;Gait training;Stair training;Therapeutic activities;Therapeutic exercise;Balance training;Neuromuscular re-education;Patient/family education;Vestibular    PT Next Visit Plan cont with balance training    PT Home Exercise Plan Access Code: [email or web address]    [id number] and Agree with Plan of Care Patient           Patient will benefit from skilled therapeutic intervention in order to improve the following deficits and impairments:  Difficulty walking, Decreased balance, Dizziness  Visit Diagnosis: Unsteadiness on feet     Problem List Patient Active Problem List   Diagnosis Date Noted  . Chronic parotitis 11/29/2015  . Parotid sialolithiasis 11/29/2015  . Rhinitis, chronic 11/29/2015  . Arthritis 04/25/2015  . 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 History of biliary T-tube placement 04/25/2015  . Non Hodgkin's lymphoma ([place]) 04/25/2015  . Ocular rosacea 04/25/2015  . Chronic diastolic heart failure ([place]) 04/25/2014  . Essential hypertension 04/25/2014  . Hypertriglyceridemia 04/25/2014  . Positional vertigo   . Sjogren's disease ([place])   . IBS (irritable bowel syndrome)   . Reflux   . Depression   . Abnormal CAT scan 01/17/2013  . Chronic cough 01/17/2013  . 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 Marginal zone lymphoma 07/17/2011  . Abscess of parotid gland 01/30/2011  . Personal history of colonic polyps 04/13/2007    Alda Lea, PT 07/09/2020, 8:17 PM  [facility] [address] [place], [place], [address] Phone: [phone]   Fax:  [phone]  Name: Nicole [name] MRN: [id number] Date of Birth: June 23, 1933

## 2020-07-10 ENCOUNTER — Ambulatory Visit
Admission: RE | Admit: 2020-07-10 | Discharge: 2020-07-10 | Disposition: A | Discharge status: Home / Self Care | Payer: Medicare Other | Source: Ambulatory Visit | Attending: Family Medicine | Admitting: Family Medicine

## 2020-07-10 DIAGNOSIS — H8110 Benign paroxysmal vertigo, unspecified ear: Secondary | ICD-10-CM

## 2020-07-18 ENCOUNTER — Other Ambulatory Visit: Payer: Self-pay

## 2020-07-18 ENCOUNTER — Ambulatory Visit: Payer: Medicare Other | Admitting: Physical Therapy

## 2020-07-18 DIAGNOSIS — H8112 Benign paroxysmal vertigo, left ear: Secondary | ICD-10-CM | POA: Diagnosis not present

## 2020-07-18 DIAGNOSIS — R2681 Unsteadiness on feet: Secondary | ICD-10-CM

## 2020-07-18 DIAGNOSIS — R262 Difficulty in walking, not elsewhere classified: Secondary | ICD-10-CM

## 2020-07-18 NOTE — Patient Instructions (Signed)
Standing Marching - SLOWLY!!   Using a chair if necessary, march in place. Repeat 10 times. Do 1 sessions per day.  http://gt2.exer.us/344   Copyright  VHI. All rights reserved.   Hip Backward Kick   Using a chair for balance, keep legs shoulder width apart and toes pointed for- ward. Slowly extend one leg back, keeping knee straight. Do not lean forward. Repeat with other leg. Repeat 10 times. Do 1 sessions per day.  http://gt2.exer.us/340     Hip Side Kick   Holding a chair for balance, keep legs shoulder width apart and toes pointed forward. Swing a leg out to side, keeping knee straight. Do not lean. Repeat using other leg. Repeat 10 times. Do 1 sessions per day.  ALSO do forward kicks - 10 reps each leg alternating  http://gt2.exer.us/342   Feet Heel-Toe "Tandem", Varied Arm Positions - Eyes Open - partial heel to toe   With eyes open, right foot directly in front of the other, arms out, look straight ahead at a stationary object. Hold 20-30 seconds. Repeat 2  times per session. Do 1 sessions per day.  Copyright  VHI. All rights reserved.       Standing On One Leg Without Support .  Stand on one leg in neutral spine without support. Hold 10 seconds. Repeat on other leg. Do 1-2 repetitions, 1 sets.  http://bt.exer.us/36   Copyright  VHI. All rights reserved.    Side-Stepping   Walk to left side with eyes open. Take even steps, leading with same foot. Make sure each foot lifts off the floor. Repeat in opposite direction. Repeat 2-3 times along counter.  Do 1 sessions per day.

## 2020-07-19 ENCOUNTER — Encounter: Payer: Self-pay | Admitting: Physical Therapy

## 2020-07-19 NOTE — Therapy (Signed)
[facility] [address] [place], [place], [address] Phone: [phone]   Fax:  [phone]  Physical Therapy Treatment & Discharge summary  Patient Details  Name: Nicole [name] MRN: [id number] Date of Birth: [birth year] Referring Provider (PT): Dr. Donald Prose   Encounter Date: 07/18/2020   PT End of Session - 07/19/20 1229    Visit Number 12    Number of Visits 12   eval + 6 visits   Date for PT Re-Evaluation 07/19/20    Authorization Type [facility] Medicare    Authorization Time Period 04-16-20 - 06-16-20;  06-04-20 - 08-30-20    PT Start Time 1402    PT Stop Time 1445    PT Time Calculation (min) 43 min    Equipment Utilized During Treatment --    Activity Tolerance Patient tolerated treatment well    Behavior During Therapy [facility] for tasks assessed/performed           Past Medical History:  Diagnosis Date  . Abscess of parotid gland 01/2011   w/parotid stones  . 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 Depression   . Heartburn   . IBS (irritable bowel syndrome)   . Non Hodgkin's lymphoma ([place])   . Personal history of colonic polyps 04/13/2007   hyperplastic  . Positional vertigo   . Reflux   . Sjogren's syndrome [facility])     Past Surgical History:  Procedure Laterality Date  . APPENDECTOMY    . BREAST BIOPSY Right 1986   Benign   . CATARACT EXTRACTION  2008 &2010   bilateral  . iredectomy  2008   left  . LUNG REMOVAL, PARTIAL  04/1999   right lung  . OOPHORECTOMY  2000  . SALIVARY GLAND SURGERY  01/2011 & 04/2011  . TONSILLECTOMY AND ADENOIDECTOMY    . VEIN SURGERY  1966    There were no vitals filed for this visit.   Subjective Assessment - 07/18/20 1404    Subjective No dizziness experienced in past week - states she has been busy this week; had hearing test at [facility] yesterday    Pertinent History Sjogren's syndrome: chronic diastolic heart failure: lymphoma; h/o abscess of parotid gland; IBS; depression;  essential tremor; lymphoma; abnormal CAT scan    Patient Stated Goals "Resolve the vertigo or make it not quite so prominent"    Currently in Pain? No/denies                             [facility] Adult PT Treatment/Exercise - 07/19/20 0001      Transfers   Transfers Sit to Stand;Stand to Sit    Sit to Stand 5: Supervision    Number of Reps Other reps (comment)   5   Comments no UE support used - feet on blue Airex       Standardized Balance Assessment   Standardized Balance Assessment Dynamic Gait Index      Dynamic Gait Index   Level Surface Normal    Change in Gait Speed Mild Impairment    Gait with Horizontal Head Turns Moderate Impairment    Gait with Vertical Head Turns Mild Impairment    Gait and Pivot Turn Normal    Step Over Obstacle Normal    Step Around Obstacles Normal    Steps Mild Impairment    Total Score 19           NeuroRe-ed:  Pt  instructed in standing balance HEP - forward, back and side kicks 10 reps each leg alternating 10 reps each Marching in place, partial tandem and SLS on each leg - see pt instructions for details       PT Education - 07/19/20 1228    Education Details Balance HEP; discussed progress and LTG's - pt feels ready for D/C    Person(s) Educated Patient    Methods Explanation;Handout;Demonstration    Comprehension Verbalized understanding;Returned demonstration            PT Short Term Goals - 07/19/20 1230      PT SHORT TERM GOAL #1   Title Complete balance assessments including DGI and TUG after Lt BPPV is resolved.    Baseline 04/30/20: met today with TUG 11.65 sec's no AD, DGI score 17/24    Status Achieved    Target Date 05/10/20      PT SHORT TERM GOAL #2   Title Pt will be independent in HEP for balance and habituation exs.    Time 3    Period Weeks    Status On-going    Target Date 05/10/20      PT SHORT TERM GOAL #3   Status Achieved             PT Long Term Goals - 07/19/20 1230       PT LONG TERM GOAL #1   Title Pt will be independent in updated HEP as appropriate. (all LTGs due 05/30/20)    Baseline met 07-18-20    Time 7    Period Weeks    Status Achieved      PT LONG TERM GOAL #2   Title Pt will improve DGI score by at least 4 points to demo increased safety with amb.    Baseline 04/30/20: baseline 17/24;    score 19/24 on 07-18-20    Time 7    Period Weeks    Status Partially Met      PT LONG TERM GOAL #3   Title Pt will have a (-) Lt Dix-Hallpike test to indicate resolution of Lt BPPV.    Baseline 05/30/20: pt reports vertigo is resolved    Status Achieved      PT LONG TERM GOAL #4   Title Improve DHI score from 36% to </= 26% to demo improvement in vertigo.    Baseline 36 on 04-16-20; score 12% on 07-18-20    Time 7    Period Weeks    Status Achieved      PT LONG TERM GOAL #5   Title Pt will have (-) Rt Dix-Hallpike and (-) Rt horizontal roll test to indicate resolution of Rt BPPV.    Baseline met 07-08-20    Time 4    Period Weeks    Status Achieved                 Plan - 07/19/20 1232    Clinical Impression Statement Pt has met LTG's #1, 3-5:  LTG #2 partially met as score has increased from 17/24 to 19/24 (not 4 point increase per stated goal).  Pt's BPPV has resolved as of this time.  Pt states she is ready for D/C at this time.    Personal Factors and Comorbidities Behavior Pattern;Comorbidity 2;Past/Current Experience    Comorbidities Lymphoma, depression, h/o abscess of parotid gland, chronic diastolic heart failure, IBS, non-Hodgkins lymphoma, Sjogren's syndrome    Examination-Activity Limitations Transfers;Bend;Stairs;Reach Overhead;Locomotion Level;Squat    Examination-Participation Restrictions Cleaning;Shop;Community Activity;Laundry;Meal  Prep    Stability/Clinical Decision Making Evolving/Moderate complexity    Rehab Potential Good    PT Frequency 1x / week    PT Duration 6 weeks   + eval for 7 visits total   PT  Treatment/Interventions ADLs/Self Care Home Management;Canalith Repostioning;Gait training;Stair training;Therapeutic activities;Therapeutic exercise;Balance training;Neuromuscular re-education;Patient/family education;Vestibular    PT Next Visit Plan D/C on 07-18-20    PT Home Exercise Plan Access Code: [email or web address]    [id number] and Agree with Plan of Care Patient           Patient will benefit from skilled therapeutic intervention in order to improve the following deficits and impairments:  Difficulty walking, Decreased balance, Dizziness  Visit Diagnosis: Unsteadiness on feet  Difficulty in walking, not elsewhere classified     Problem List Patient Active Problem List   Diagnosis Date Noted  . Chronic parotitis 11/29/2015  . Parotid sialolithiasis 11/29/2015  . Rhinitis, chronic 11/29/2015  . Arthritis 04/25/2015  . 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 History of biliary T-tube placement 04/25/2015  . Non Hodgkin's lymphoma ([place]) 04/25/2015  . Ocular rosacea 04/25/2015  . Chronic diastolic heart failure ([place]) 04/25/2014  . Essential hypertension 04/25/2014  . Hypertriglyceridemia 04/25/2014  . Positional vertigo   . Sjogren's disease ([place])   . IBS (irritable bowel syndrome)   . Reflux   . Depression   . Abnormal CAT scan 01/17/2013  . Chronic cough 01/17/2013  . 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 Marginal zone lymphoma 07/17/2011  . Abscess of parotid gland 01/30/2011  . Personal history of colonic polyps 04/13/2007      PHYSICAL THERAPY DISCHARGE SUMMARY  Visits from Start of Care: 12  Current functional level related to goals / functional outcomes: See above for progress towards goals - BPPV has resolved as of this time; DHI score has improved from 36% to 12%   Remaining deficits: Age-related decreased balance skills; decreased high level gait   Education / Equipment: Pt has been instructed in HEP for balance exercises. Plan: Patient agrees to discharge.   Patient goals were met. Patient is being discharged due to not returning since the last visit.  ?????        Alda Lea, PT 07/19/2020, 12:37 PM  [facility] [address] [place], [place], [address] Phone: [phone]   Fax:  [phone]  Name: LEXIANA SPINDEL MRN: [id number] Date of Birth: 03-10-1933

## 2020-08-02 ENCOUNTER — Encounter: Payer: Self-pay | Admitting: Cardiology

## 2020-08-02 ENCOUNTER — Other Ambulatory Visit: Payer: Self-pay

## 2020-08-02 ENCOUNTER — Telehealth: Payer: Self-pay | Admitting: Radiology

## 2020-08-02 ENCOUNTER — Ambulatory Visit: Payer: Medicare Other | Admitting: Cardiology

## 2020-08-02 VITALS — BP 148/70 | HR 65 | Ht 63.0 in | Wt 150.0 lb

## 2020-08-02 DIAGNOSIS — R55 Syncope and collapse: Secondary | ICD-10-CM | POA: Diagnosis not present

## 2020-08-02 DIAGNOSIS — I10 Essential (primary) hypertension: Secondary | ICD-10-CM | POA: Diagnosis not present

## 2020-08-02 DIAGNOSIS — I5032 Chronic diastolic (congestive) heart failure: Secondary | ICD-10-CM | POA: Diagnosis not present

## 2020-08-02 MED ORDER — HYDROCHLOROTHIAZIDE 12.5 MG PO CAPS: 12.5000 mg | ORAL_CAPSULE | Freq: Every day | ORAL | 3 refills | Status: DC

## 2020-08-02 MED ORDER — METOPROLOL SUCCINATE ER 25 MG PO TB24: 25.0000 mg | ORAL_TABLET | Freq: Every day | ORAL | 3 refills | Status: DC

## 2020-08-02 NOTE — Patient Instructions (Signed)
Medication Instructions:  The current medical regimen is effective;  continue present plan and medications.  *If you need a refill on your cardiac medications before your next appointment, please call your pharmacy*  Testing/Procedures: ZIO XT- Long Term Monitor Instructions   Your physician has requested you wear your ZIO patch monitor 14 days.   This is a single patch monitor.  [employer] supplies one patch monitor per enrollment.  Additional stickers are not available.   Please do not apply patch if you will be having a Nuclear Stress Test, Echocardiogram, Cardiac CT, MRI, or Chest Xray during the time frame you would be wearing the monitor. The patch cannot be worn during these tests.  You cannot remove and re-apply the ZIO XT patch monitor.   Your ZIO patch monitor will be sent USPS Priority mail from [employer] directly to your home address. The monitor may also be mailed to a PO BOX if home delivery is not available.   It may take 3-5 days to receive your monitor after you have been enrolled.   Once you have received you monitor, please review enclosed instructions.  Your monitor has already been registered assigning a specific monitor serial # to you.   Applying the monitor   Shave hair from upper left chest.   Hold abrader disc by orange tab.  Rub abrader in 40 strokes over left upper chest as indicated in your monitor instructions.   Clean area with 4 enclosed alcohol pads .  Use all pads to assure are is cleaned thoroughly.  Let dry.   Apply patch as indicated in monitor instructions.  Patch will be place under collarbone on left side of chest with arrow pointing upward.   Rub patch adhesive wings for 2 minutes.Remove white label marked "1".  Remove white label marked "2".  Rub patch adhesive wings for 2 additional minutes.   While looking in a mirror, press and release button in center of patch.  A small green light will flash 3-4 times .  This will be your only  indicator the monitor has been turned on.     Do not shower for the first 24 hours.  You may shower after the first 24 hours.   Press button if you feel a symptom. You will hear a small click.  Record Date, Time and Symptom in the Patient Log Book.   When you are ready to remove patch, follow instructions on last 2 pages of Patient Log Book.  Stick patch monitor onto last page of Patient Log Book.   Place Patient Log Book in Blue box.  Use locking tab on box and tape box closed securely.  The Orange and White box has prepaid postage on it.  Please place in mailbox as soon as possible.  Your physician should have your test results approximately 7 days after the monitor has been mailed back to [employer].   Call [employer] Customer Care at [phone] if you have questions regarding your ZIO XT patch monitor.  Call them immediately if you see an orange light blinking on your monitor.   If your monitor falls off in less than 4 days contact our Monitor department at [phone].  If your monitor becomes loose or falls off after 4 days call [employer] at [phone] for suggestions on securing your monitor.   Follow-Up: At [facility], you and your health needs are our priority.  As part of our continuing mission to provide you with exceptional heart care, we have   created designated Provider Care Teams.  These Care Teams include your primary Cardiologist (physician) and Advanced Practice Providers (APPs -  Physician Assistants and Nurse Practitioners) who all work together to provide you with the care you need, when you need it.  We recommend signing up for the patient portal called "MyChart".  Sign up information is provided on this After Visit Summary.  MyChart is used to connect with patients for Virtual Visits (Telemedicine).  Patients are able to view lab/test results, encounter notes, upcoming appointments, etc.  Non-urgent messages can be sent to your provider as well.   To learn  more about what you can do with MyChart, go to [email or web address].    Your next appointment:   12 month(s)  The format for your next appointment:   In Person  Provider:   Candee Furbish, MD   Thank you for choosing [facility]!!

## 2020-08-02 NOTE — Telephone Encounter (Signed)
Enrolled patient for a 14 day Zio XT Monitor to be mailed to patients home  

## 2020-08-02 NOTE — Progress Notes (Signed)
Cardiology Office Note:    Date:  08/02/2020   ID:  Nicole [name], Nicole [name] 05-20-1933, MRN [id number]  PCP:  Harlan Stains, MD  [facility] Cardiologist:  Candee Furbish, MD  [facility] Electrophysiologist:  None   Referring MD: Harlan Stains, MD     History of Present Illness:    Nicole [name] is a 86 y.o. female here for follow-up diastolic heart failure, parotid gland disease.  Prior notes reviewed:Diastolic dysfunction-on low-dose metoprolol. Prior uphill SOB has increased, moderate in severity, relieved with rest, no associated symptoms of chest discomfort or heaviness. CT of chest showed 2 small areas of concern. Dr. Arlyce Dice performed surgery.   She has had hypertriglyceridemia. This is improved significantly with fish oil. Down into the 150-190 range.  Traveling often. [place] trips. She stays in youth hostils. Enjoying time with her friend Shanon Brow.  [place] travel.   Lymphoma. Eye. 5 chemo tx. Rituxan. [facility].  Last 10/2014  Noted SOB with inclines (declined since knee pain has limited walking). No CP. No real change in symptoms. Parotid gland issues have resolved.  Her blood pressure was slightly elevated at home. She showed me home values. We decided to add back on HCTZ 12.5 mg.  07/30/17 - brother CABG 25.  Overall she is doing very well.  No chest pain, no significant change in shortness of breath, only when exerting herself significantly.  She had to curtail her travels recently, her significant other's dog has been sick.  She is going to a destination wedding in [place] soon.  Denies any syncope, bleeding, orthopnea, PND.  08/01/18 - [place] travel. [place] wedding. [place].  Doing well.  She does have an essential tremor.  She would like to transition from metoprolol over to propranolol.  This is fine.  No chest pain fevers chills nausea vomiting shortness of breath  08/01/2019-here for the follow-up of diastolic dysfunction, shortness of  breath, family history of CAD.  Overall feels stable without any significant fevers chills nausea vomiting syncope bleeding.  Medication adherence has been good.  08/05/2020-here for follow-up.  Overall been doing quite well.  Diastolic dysfunction seems to be quite stable.   She remembers seeing a step up on the sidewalk/curb bigger than usual and the next thing she knew she steped up on side walk. Fell.  Hit face. Did not put hand out to catch her fall she says. She thinks she had syncope just before the fall. Dr. Dema Severin checked an MRI and this showed mild age-related cerebral atrophy and chronic microvascular ischemic changes.  She also had a remote right basal ganglia lacunar insult.  And also remote right middle cerebral peduncle microhemorrhage.  She has not had any further syncopal episodes.  No prior syncopal episodes to this.  Denies any chest pain fever chills nausea vomiting syncope.  Past Medical History:  Diagnosis Date  . Abscess of parotid gland 01/2011   w/parotid stones  . 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 Depression   . Heartburn   . IBS (irritable bowel syndrome)   . Non Hodgkin's lymphoma ([place])   . Personal history of colonic polyps 04/13/2007   hyperplastic  . Positional vertigo   . Reflux   . Sjogren's syndrome [facility])     Past Surgical History:  Procedure Laterality Date  . APPENDECTOMY    . BREAST BIOPSY Right 1986   Benign   . CATARACT EXTRACTION  2008 &2010   bilateral  . iredectomy  2008  left  . LUNG REMOVAL, PARTIAL  04/1999   right lung  . OOPHORECTOMY  2000  . SALIVARY GLAND SURGERY  01/2011 & 04/2011  . TONSILLECTOMY AND ADENOIDECTOMY    . VEIN SURGERY  1966    Current Medications: Current Meds  Medication Sig  . aspirin 81 MG tablet Take 81 mg by mouth daily.    . Biotin 1000 MCG tablet Take 1,000 mcg by mouth daily.  . Calcium Carbonate-Vitamin D (CALCIUM-VITAMIN D) 600-200 MG-UNIT CAPS Take 1 tablet by mouth daily.   . Cholecalciferol (VITAMIN  D3) 1000 UNITS CAPS Take 1,000 Units by mouth daily.    . Flaxseed, Linseed, (FLAX SEED OIL) 1000 MG CAPS Take 1,000 mg by mouth daily.    . Glucosamine 750 MG TABS Take 750 mg by mouth 2 (two) times daily.   . hydrochlorothiazide (MICROZIDE) 12.5 MG capsule Take 1 capsule (12.5 mg total) by mouth daily.  . Methylsulfonylmethane (MSM) 1000 MG CAPS Take 1,000 mg by mouth daily.    . metoprolol succinate (TOPROL-XL) 25 MG 24 hr tablet Take 1 tablet (25 mg total) by mouth daily.  . Multiple Vitamin (MULTIVITAMIN) capsule Take 1 capsule by mouth daily.    . OMEGA 3 1000 MG CAPS Take 1,000 mg by mouth 2 (two) times daily.    Marland Kitchen omeprazole (PRILOSEC) 10 MG capsule Take 10 mg by mouth every morning.  . ondansetron (ZOFRAN ODT) 4 MG disintegrating tablet Take 1 tablet (4 mg total) by mouth every 8 (eight) hours as needed for nausea or vomiting.  [name] Glycol-Propyl Glycol (SYSTANE OP) Apply 1 drop to eye every 4 (four) hours as needed (dry eyes). Both eyes  . PREMARIN 0.45 MG tablet Take 1 tablet by mouth daily.  . rosuvastatin (CRESTOR) 5 MG tablet Take 5 mg by mouth 2 (two) times a week. Tuesday and Friday  . vitamin C (ASCORBIC ACID) 500 MG tablet Take 500 mg by mouth daily.    . [DISCONTINUED] hydrochlorothiazide (MICROZIDE) 12.5 MG capsule Take 1 capsule by mouth once daily  . [DISCONTINUED] metoprolol succinate (TOPROL-XL) 25 MG 24 hr tablet Take 1 tablet by mouth once daily     Allergies:   Ace inhibitors and Protonix [pantoprazole sodium]   Social History   Socioeconomic History  . Marital status: Divorced    Spouse name: Not on file  . Number of children: 5  . Years of education: Not on file  . Highest education level: Not on file  Occupational History  . Occupation: retired Programmer, multimedia: RETIRED  Tobacco Use  . Smoking status: Former Research scientist (life sciences)  . Smokeless tobacco: Never Used  Vaping Use  . Vaping Use: Never used  Substance and Sexual Activity  . Alcohol use: No  . Drug  use: No  . Sexual activity: Not on file  Other Topics Concern  . Not on file  Social History Narrative  . Not on file   Social Determinants of Health   Financial Resource Strain:   . Difficulty of Paying Living Expenses: Not on file  Food Insecurity:   . Worried About Charity fundraiser in the Last Year: Not on file  . Ran Out of Food in the Last Year: Not on file  Transportation Needs:   . Lack of Transportation (Medical): Not on file  . Lack of Transportation (Non-Medical): Not on file  Physical Activity:   . Days of Exercise per Week: Not on file  . Minutes of Exercise per  Session: Not on file  Stress:   . Feeling of Stress : Not on file  Social Connections:   . Frequency of Communication with Friends and Family: Not on file  . Frequency of Social Gatherings with Friends and Family: Not on file  . Attends Religious Services: Not on file  . Active Member of Clubs or Organizations: Not on file  . Attends Archivist Meetings: Not on file  . Marital Status: Not on file     Family History: The patient's family history includes Celiac disease in her mother; Heart disease in her father and mother; Heart failure in her father; Kidney cancer in her daughter.  ROS:   Please see the history of present illness.     All other systems reviewed and are negative.  EKGs/Labs/Other Studies Reviewed:    The following studies were reviewed today:  ECHO 08/03/19:  1. Left ventricular ejection fraction, by visual estimation, is 60 to  65%. The left ventricle has normal function. There is no left ventricular  hypertrophy.  2. The left ventricle has no regional wall motion abnormalities.  3. Global right ventricle has normal systolic function.The right  ventricular size is normal. No increase in right ventricular wall  thickness.  4. The mitral valve is normal in structure. No evidence of mitral valve  regurgitation.  5. The tricuspid valve is normal in structure.  Tricuspid valve  regurgitation is trivial.  6. The aortic valve was not well visualized. Aortic valve regurgitation  is not visualized. No evidence of aortic valve stenosis.  7. The pulmonic valve was not well visualized. Pulmonic valve  regurgitation is not visualized.  8. Left atrial size was normal.  9. Right atrial size was normal.  10. The inferior vena cava is normal in size with greater than 50%  respiratory variability, suggesting right atrial pressure of 3 mmHg.  11. The tricuspid regurgitant velocity is 2.04 m/s, and with an assumed  right atrial pressure of 3 mmHg, the estimated right ventricular systolic  pressure is normal at 19.6 mmHg.   EKG:  EKG is  ordered today.  The ekg ordered today demonstrates sinus rhythm 65 no other abnormalities  Recent Labs: No results found for requested labs within last 8760 hours.  Recent Lipid Panel No results found for: CHOL, TRIG, HDL, CHOLHDL, VLDL, LDLCALC, LDLDIRECT   Risk Assessment/Calculations:      Physical Exam:    VS:  BP (!) 148/70   Pulse 65   Ht 5\' 3"  (1.6 m)   Wt 150 lb (68 kg)   LMP  (LMP Unknown)   SpO2 96%   BMI 26.57 kg/m     Wt Readings from Last 3 Encounters:  08/02/20 150 lb (68 kg)  08/01/19 152 lb (68.9 kg)  08/01/18 152 lb 12.8 oz (69.3 kg)     GEN:  Well nourished, well developed in no acute distress HEENT: Normal, bruising noted NECK: No JVD; No carotid bruits LYMPHATICS: No lymphadenopathy CARDIAC: RRR, no murmurs, rubs, gallops RESPIRATORY:  Clear to auscultation without rales, wheezing or rhonchi  ABDOMEN: Soft, non-tender, non-distended MUSCULOSKELETAL:  No edema; No deformity  SKIN: Warm and dry NEUROLOGIC:  Alert and oriented x 3 PSYCHIATRIC:  Normal affect   ASSESSMENT:    1. Chronic diastolic heart failure ([place])   2. Essential hypertension   3. Syncope and collapse    PLAN:    In order of problems listed above:  Syncope -She states that she is convinced that she  blacked out just before she fell. -We will check a ZIO patch monitor to ensure that she does not have any dangerous heart arrhythmias.  Her EKG today is normal. -Common things being common, she does remember being faced with a large curb that she needed to step up.  She does not remember trying to catch her fall however.  She hit her head quite hard.  If the monitor does not show any dangerous or adverse arrhythmias, it is likely that this was a mechanical fall that resulted in transient loss of consciousness secondary to head injury.  When I propose this theory to her, she was quite adamant that it was not purely mechanical and that she blacked out prior to the fall.  She also thought that the microhemorrhage caused the blackout possibly.  Chronic diastolic heart failure -Fairly chronic shortness of breath but seems to be improved. -Previous nuclear stress test reassuring with no ischemia -CT scan of chest showing no parenchymal lung disease no pericardial effusion. -Last echocardiogram with normal EF.  Heart murmur -Last echocardiogram as above, likely flow murmur.  Essential hypertension -Overall well controlled.  No changes made.  Medications reviewed as above.  We will continue with current medical management.  Lymphoma -[facility].    Shared Decision Making/Informed Consent        Medication Adjustments/Labs and Tests Ordered: Current medicines are reviewed at length with the patient today.  Concerns regarding medicines are outlined above.  Orders Placed This Encounter  Procedures  . LONG TERM MONITOR (3-14 DAYS)  . EKG 12-Lead   Meds ordered this encounter  Medications  . hydrochlorothiazide (MICROZIDE) 12.5 MG capsule    Sig: Take 1 capsule (12.5 mg total) by mouth daily.    Dispense:  90 capsule    Refill:  3  . metoprolol succinate (TOPROL-XL) 25 MG 24 hr tablet    Sig: Take 1 tablet (25 mg total) by mouth daily.    Dispense:  90 tablet     Refill:  3    Patient Instructions  Medication Instructions:  The current medical regimen is effective;  continue present plan and medications.  *If you need a refill on your cardiac medications before your next appointment, please call your pharmacy*  Testing/Procedures: [place] Monitor Instructions   Your physician has requested you wear your ZIO patch monitor 14 days.   This is a single patch monitor.  [employer] supplies one patch monitor per enrollment.  Additional stickers are not available.   Please do not apply patch if you will be having a Nuclear Stress Test, Echocardiogram, Cardiac CT, MRI, or Chest Xray during the time frame you would be wearing the monitor. The patch cannot be worn during these tests.  You cannot remove and re-apply the ZIO XT patch monitor.   Your ZIO patch monitor will be sent USPS Priority mail from [facility] directly to your home address. The monitor may also be mailed to a PO BOX if home delivery is not available.   It may take 3-5 days to receive your monitor after you have been enrolled.   Once you have received you monitor, please review enclosed instructions.  Your monitor has already been registered assigning a specific monitor serial # to you.   Applying the monitor   Shave hair from upper left chest.   Hold abrader disc by orange tab.  Rub abrader in 40 strokes over left upper chest as indicated in your monitor  instructions.   Clean area with 4 enclosed alcohol pads .  Use all pads to assure are is cleaned thoroughly.  Let dry.   Apply patch as indicated in monitor instructions.  Patch will be place under collarbone on left side of chest with arrow pointing upward.   Rub patch adhesive wings for 2 minutes.Remove white label marked "1".  Remove white label marked "2".  Rub patch adhesive wings for 2 additional minutes.   While looking in a mirror, press and release button in center of patch.  A small green light will flash  3-4 times .  This will be your only indicator the monitor has been turned on.     Do not shower for the first 24 hours.  You may shower after the first 24 hours.   Press button if you feel a symptom. You will hear a small click.  Record Date, Time and Symptom in the Patient Log Book.   When you are ready to remove patch, follow instructions on last 2 pages of Patient Log Book.  Stick patch monitor onto last page of Patient Log Book.   Place Patient Log Book in [place] box.  Use locking tab on box and tape box closed securely.  The [employer] and [employer] has [employer] on it.  Please place in mailbox as soon as possible.  Your physician should have your test results approximately 7 days after the monitor has been mailed back to [facility].   Call [place] at [phone] if you have questions regarding your ZIO XT patch monitor.  Call them immediately if you see an orange light blinking on your monitor.   If your monitor falls off in less than 4 days contact our Monitor department at [phone].  If your monitor becomes loose or falls off after 4 days call [employer] at [phone] for suggestions on securing your monitor.   Follow-Up: At [facility], you and your health needs are our priority.  As part of our continuing mission to provide you with exceptional heart care, we have created designated Provider Care Teams.  These Care Teams include your primary Cardiologist (physician) and Advanced Practice Providers (APPs -  Physician Assistants and Nurse Practitioners) who all work together to provide you with the care you need, when you need it.  We recommend signing up for the patient portal called "MyChart".  Sign up information is provided on this After Visit Summary.  MyChart is used to connect with patients for Virtual Visits (Telemedicine).  Patients are able to view lab/test results, encounter notes, upcoming appointments, etc.  Non-urgent messages can be sent to  your provider as well.   To learn more about what you can do with MyChart, go to [email or web address].    Your next appointment:   12 month(s)  The format for your next appointment:   In Person  Provider:   Candee Furbish, MD   Thank you for choosing [facility]!!        Signed, Candee Furbish, MD  08/02/2020 12:27 PM    [place]

## 2020-08-06 ENCOUNTER — Ambulatory Visit (INDEPENDENT_AMBULATORY_CARE_PROVIDER_SITE_OTHER): Payer: Medicare Other

## 2020-08-06 DIAGNOSIS — R55 Syncope and collapse: Secondary | ICD-10-CM

## 2020-09-06 DIAGNOSIS — Z79899 Other long term (current) drug therapy: Secondary | ICD-10-CM | POA: Diagnosis not present

## 2020-09-06 DIAGNOSIS — I10 Essential (primary) hypertension: Secondary | ICD-10-CM | POA: Diagnosis not present

## 2020-09-17 DIAGNOSIS — H40013 Open angle with borderline findings, low risk, bilateral: Secondary | ICD-10-CM | POA: Diagnosis not present

## 2020-09-17 DIAGNOSIS — H02411 Mechanical ptosis of right eyelid: Secondary | ICD-10-CM | POA: Diagnosis not present

## 2020-09-17 DIAGNOSIS — H04123 Dry eye syndrome of bilateral lacrimal glands: Secondary | ICD-10-CM | POA: Diagnosis not present

## 2020-09-19 DIAGNOSIS — C8200 Follicular lymphoma grade I, unspecified site: Secondary | ICD-10-CM | POA: Diagnosis not present

## 2020-11-05 DIAGNOSIS — Z85828 Personal history of other malignant neoplasm of skin: Secondary | ICD-10-CM | POA: Diagnosis not present

## 2020-11-05 DIAGNOSIS — D2271 Melanocytic nevi of right lower limb, including hip: Secondary | ICD-10-CM | POA: Diagnosis not present

## 2020-11-05 DIAGNOSIS — L814 Other melanin hyperpigmentation: Secondary | ICD-10-CM | POA: Diagnosis not present

## 2020-11-05 DIAGNOSIS — L570 Actinic keratosis: Secondary | ICD-10-CM | POA: Diagnosis not present

## 2020-11-05 DIAGNOSIS — D1801 Hemangioma of skin and subcutaneous tissue: Secondary | ICD-10-CM | POA: Diagnosis not present

## 2020-11-05 DIAGNOSIS — C44319 Basal cell carcinoma of skin of other parts of face: Secondary | ICD-10-CM | POA: Diagnosis not present

## 2020-11-05 DIAGNOSIS — L821 Other seborrheic keratosis: Secondary | ICD-10-CM | POA: Diagnosis not present

## 2020-11-05 DIAGNOSIS — D2272 Melanocytic nevi of left lower limb, including hip: Secondary | ICD-10-CM | POA: Diagnosis not present

## 2020-11-22 DIAGNOSIS — L98499 Non-pressure chronic ulcer of skin of other sites with unspecified severity: Secondary | ICD-10-CM | POA: Diagnosis not present

## 2020-11-22 DIAGNOSIS — Z85828 Personal history of other malignant neoplasm of skin: Secondary | ICD-10-CM | POA: Diagnosis not present

## 2020-11-27 DIAGNOSIS — C4401 Basal cell carcinoma of skin of lip: Secondary | ICD-10-CM | POA: Diagnosis not present

## 2020-12-10 ENCOUNTER — Ambulatory Visit: Payer: Medicare Other | Attending: Internal Medicine

## 2020-12-10 ENCOUNTER — Other Ambulatory Visit (HOSPITAL_BASED_OUTPATIENT_CLINIC_OR_DEPARTMENT_OTHER): Payer: Self-pay

## 2020-12-10 ENCOUNTER — Other Ambulatory Visit: Payer: Self-pay

## 2020-12-10 DIAGNOSIS — Z23 Encounter for immunization: Secondary | ICD-10-CM

## 2020-12-10 NOTE — Progress Notes (Signed)
   Covid-19 Vaccination Clinic  Name:  Nicole [name]    MRN: [id number] DOB: 07/29/1933  12/10/2020  [name] was observed post Covid-19 immunization for 15 minutes without incident. She was provided with Vaccine Information Sheet and instruction to access the V-Safe system.   [name] was instructed to call 911 with any severe reactions post vaccine: Marland Kitchen Difficulty breathing  . Swelling of face and throat  . A fast heartbeat  . A bad rash all over body  . Dizziness and weakness   Immunizations Administered    Name Date Dose VIS Date Route   PFIZER Comrnaty([place]) Covid-19 Vaccine 12/10/2020  2:05 PM 0.3 mL 08/08/2020 Intramuscular   Manufacturer: [place]   Lot: [id number]   NDC: [phone]

## 2020-12-11 ENCOUNTER — Ambulatory Visit: Payer: Medicare Other

## 2020-12-11 ENCOUNTER — Other Ambulatory Visit (HOSPITAL_BASED_OUTPATIENT_CLINIC_OR_DEPARTMENT_OTHER): Payer: Self-pay

## 2020-12-13 ENCOUNTER — Other Ambulatory Visit (HOSPITAL_BASED_OUTPATIENT_CLINIC_OR_DEPARTMENT_OTHER): Payer: Self-pay

## 2020-12-13 MED ORDER — COVID-19 MRNA VACCINE (PFIZER) 30 MCG/0.3ML IM SUSP
INTRAMUSCULAR | 0 refills | Status: DC
  Filled 2020-12-13: qty 0.3, 1d supply, fill #0

## 2021-01-02 ENCOUNTER — Other Ambulatory Visit: Payer: Self-pay | Admitting: Family Medicine

## 2021-01-02 DIAGNOSIS — Z1231 Encounter for screening mammogram for malignant neoplasm of breast: Secondary | ICD-10-CM

## 2021-01-20 ENCOUNTER — Other Ambulatory Visit: Payer: Self-pay | Admitting: Cardiology

## 2021-02-20 ENCOUNTER — Ambulatory Visit: Payer: Medicare Other | Admitting: Orthopedic Surgery

## 2021-02-20 ENCOUNTER — Encounter: Payer: Self-pay | Admitting: Orthopedic Surgery

## 2021-02-20 ENCOUNTER — Ambulatory Visit: Payer: Self-pay

## 2021-02-20 DIAGNOSIS — M19071 Primary osteoarthritis, right ankle and foot: Secondary | ICD-10-CM | POA: Diagnosis not present

## 2021-02-20 DIAGNOSIS — M76821 Posterior tibial tendinitis, right leg: Secondary | ICD-10-CM | POA: Diagnosis not present

## 2021-02-20 DIAGNOSIS — M79671 Pain in right foot: Secondary | ICD-10-CM | POA: Diagnosis not present

## 2021-02-20 DIAGNOSIS — M79672 Pain in left foot: Secondary | ICD-10-CM | POA: Diagnosis not present

## 2021-02-20 DIAGNOSIS — M19072 Primary osteoarthritis, left ankle and foot: Secondary | ICD-10-CM | POA: Diagnosis not present

## 2021-02-20 DIAGNOSIS — K219 Gastro-esophageal reflux disease without esophagitis: Secondary | ICD-10-CM | POA: Diagnosis not present

## 2021-02-20 DIAGNOSIS — I10 Essential (primary) hypertension: Secondary | ICD-10-CM | POA: Diagnosis not present

## 2021-02-20 DIAGNOSIS — I5032 Chronic diastolic (congestive) heart failure: Secondary | ICD-10-CM | POA: Diagnosis not present

## 2021-02-20 NOTE — Progress Notes (Signed)
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  Patient states the pain is daily she wakes up with pain and has some cramping in her feet.  Patient states that she wore a fracture boot for about 4 weeks for a diagnosed torn tendon.  Past medical history negative for tobacco diabetes or hypertension.  Assessment & Plan: Visit Diagnoses:  1. Bilateral foot pain   2. Posterior tibial tendinitis, right leg     Plan: Discussed that with the pain across the midfoot due to osteoarthritis she needs to be in a stiff cushioned shoe that does not bend and therefore does not put stress across the midfoot recommended over-the-counter orthotics such as sole she can place a carbon plate under the orthotic in a more flexible shoe or she can get a stiff soled sneaker such as new balance.  Recommended topical Voltaren gel.  Discussed that surgical intervention would require fusion across the Lisfranc complex.  Follow-Up Instructions: No follow-ups on file.   Ortho Exam  Patient is alert, oriented, no adenopathy, well-dressed, normal affect, normal respiratory effort. Examination patient has a good pulse bilaterally she has good ankle and subtalar motion she does have pes planus worse on the right than the left with more pronation and valgus of the right foot than the left foot.  Patient can do a single limb heel raise with intact posterior  tibial tendon function.  She is point tender to palpation across the Lisfranc complex.  There is no redness no cellulitis no signs of infection or Charcot arthropathy.  Imaging: No results found. No images are attached to the encounter.  Labs: Lab Results  Component Value Date   REPTSTATUS 10/29/2017 FINAL 10/27/2017   GRAMSTAIN  04/13/2011    FEW WBC PRESENT, PREDOMINANTLY PMN NO SQUAMOUS EPITHELIAL CELLS SEEN NO ORGANISMS SEEN   CULT  10/27/2017    NO GROWTH Performed at [facility], [place] [address], [place], [place] [address]      Lab Results  Component Value Date   ALBUMIN 3.8 10/27/2017   ALBUMIN 3.4 (L) 04/09/2011    No results found for: MG No results found for: VD25OH  No results found for: PREALBUMIN CBC EXTENDED Latest Ref Rng & Units 10/27/2017 04/09/2011 02/24/2011  WBC 4.0 - 10.5 K/uL 22.6(H) 9.6 8.0  RBC 3.87 - 5.11 MIL/uL 4.72 4.73 3.79(L)  HGB 12.0 - 15.0 g/dL 14.4 13.9 11.1(L)  HCT 36.0 - 46.0 % 41.1 41.1 32.5(L)  PLT 150 - 400 K/uL 342 312 358  NEUTROABS 1.7 - 7.7 K/uL 20.0(H) - -  LYMPHSABS 0.7 - 4.0 K/uL 1.8 - -     There is no height or weight on file to calculate  BMI.  Orders:  Orders Placed This Encounter  Procedures   XR Foot Complete Right   XR Foot Complete Left   No orders of the defined types were placed in this encounter.    Procedures: No procedures performed  Clinical Data: No additional findings.  ROS:  All other systems negative, except as noted in the HPI. Review of Systems  Objective: Vital Signs: LMP  (LMP Unknown)   Specialty Comments:  No specialty comments available.  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   History of biliary T-tube placement 04/25/2015   Non Hodgkin's lymphoma ([place]) 04/25/2015   Ocular rosacea  04/25/2015   Chronic diastolic heart failure ([place]) 04/25/2014   Essential hypertension 04/25/2014   Hypertriglyceridemia 04/25/2014   Positional vertigo    Sjogren's disease (HCC)    IBS (irritable bowel syndrome)    Reflux    Depression    Abnormal CAT scan 01/17/2013   Chronic cough 01/17/2013   Disorder of orbit 10/27/2012   Marginal zone lymphoma 07/17/2011   Abscess of parotid gland 01/30/2011   Personal history of colonic polyps 04/13/2007   Past Medical History:  Diagnosis Date   Abscess of parotid gland 01/2011   w/parotid stones   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   Depression    Heartburn    IBS (irritable bowel syndrome)    Non Hodgkin's lymphoma ([place])    Personal history of colonic polyps 04/13/2007   hyperplastic   Positional vertigo    Reflux    Sjogren's syndrome (H[facility])     Family History  Problem Relation Age of Onset   Celiac disease Mother    Heart disease Mother    Heart disease Father    Heart failure Father    Kidney cancer Daughter     Past Surgical History:  Procedure Laterality Date   APPENDECTOMY     BREAST BIOPSY Right 1986   Benign    CATARACT EXTRACTION  2008 &2010   bilateral   iredectomy  2008   left   LUNG REMOVAL, PARTIAL  04/1999   right lung   OOPHORECTOMY  2000   SALIVARY GLAND SURGERY  01/2011 & 04/2011   TONSILLECTOMY AND ADENOIDECTOMY     VEIN SURGERY  1966   Social History   Occupational History   Occupation: retired Programmer, multimedia: RETIRED  Tobacco Use   Smoking status: Former    Pack years: 0.00   Smokeless tobacco: Never  Vaping Use   Vaping Use: Never used  Substance and Sexual Activity   Alcohol use: No   Drug use: No   Sexual activity: Not on file

## 2021-02-21 ENCOUNTER — Encounter: Payer: Self-pay | Admitting: Orthopedic Surgery

## 2021-02-25 ENCOUNTER — Ambulatory Visit
Admission: RE | Admit: 2021-02-25 | Discharge: 2021-02-25 | Disposition: A | Discharge status: Home / Self Care | Payer: Medicare Other | Source: Ambulatory Visit | Attending: Family Medicine | Admitting: Family Medicine

## 2021-02-25 ENCOUNTER — Other Ambulatory Visit: Payer: Self-pay

## 2021-02-25 DIAGNOSIS — Z1231 Encounter for screening mammogram for malignant neoplasm of breast: Secondary | ICD-10-CM

## 2021-04-02 ENCOUNTER — Telehealth: Payer: Self-pay | Admitting: Cardiology

## 2021-04-02 NOTE — Telephone Encounter (Signed)
    Pt is requesting to switch from Dr. Gillian Shields to Dr. Johney Frame, pt would like to see female doctor

## 2021-05-06 DIAGNOSIS — H524 Presbyopia: Secondary | ICD-10-CM | POA: Diagnosis not present

## 2021-05-06 DIAGNOSIS — H35361 Drusen (degenerative) of macula, right eye: Secondary | ICD-10-CM | POA: Diagnosis not present

## 2021-05-06 DIAGNOSIS — H35373 Puckering of macula, bilateral: Secondary | ICD-10-CM | POA: Diagnosis not present

## 2021-05-06 DIAGNOSIS — H02411 Mechanical ptosis of right eyelid: Secondary | ICD-10-CM | POA: Diagnosis not present

## 2021-05-06 DIAGNOSIS — H40013 Open angle with borderline findings, low risk, bilateral: Secondary | ICD-10-CM | POA: Diagnosis not present

## 2021-05-28 ENCOUNTER — Other Ambulatory Visit (HOSPITAL_BASED_OUTPATIENT_CLINIC_OR_DEPARTMENT_OTHER): Payer: Self-pay

## 2021-06-18 ENCOUNTER — Ambulatory Visit: Payer: Medicare Other

## 2021-06-25 DIAGNOSIS — R8290 Unspecified abnormal findings in urine: Secondary | ICD-10-CM | POA: Diagnosis not present

## 2021-06-25 DIAGNOSIS — K118 Other diseases of salivary glands: Secondary | ICD-10-CM | POA: Diagnosis not present

## 2021-06-25 DIAGNOSIS — Z23 Encounter for immunization: Secondary | ICD-10-CM | POA: Diagnosis not present

## 2021-06-25 DIAGNOSIS — K219 Gastro-esophageal reflux disease without esophagitis: Secondary | ICD-10-CM | POA: Diagnosis not present

## 2021-06-25 DIAGNOSIS — Z8673 Personal history of transient ischemic attack (TIA), and cerebral infarction without residual deficits: Secondary | ICD-10-CM | POA: Diagnosis not present

## 2021-06-25 DIAGNOSIS — C8590 Non-Hodgkin lymphoma, unspecified, unspecified site: Secondary | ICD-10-CM | POA: Diagnosis not present

## 2021-06-25 DIAGNOSIS — H8110 Benign paroxysmal vertigo, unspecified ear: Secondary | ICD-10-CM | POA: Diagnosis not present

## 2021-06-25 DIAGNOSIS — I10 Essential (primary) hypertension: Secondary | ICD-10-CM | POA: Diagnosis not present

## 2021-06-25 DIAGNOSIS — Z0000 Encounter for general adult medical examination without abnormal findings: Secondary | ICD-10-CM | POA: Diagnosis not present

## 2021-07-30 DIAGNOSIS — R8290 Unspecified abnormal findings in urine: Secondary | ICD-10-CM | POA: Diagnosis not present

## 2021-07-31 DIAGNOSIS — K1123 Chronic sialoadenitis: Secondary | ICD-10-CM | POA: Diagnosis not present

## 2021-07-31 DIAGNOSIS — K115 Sialolithiasis: Secondary | ICD-10-CM | POA: Diagnosis not present

## 2021-08-08 ENCOUNTER — Other Ambulatory Visit: Payer: Self-pay | Admitting: Otolaryngology

## 2021-08-08 DIAGNOSIS — K115 Sialolithiasis: Secondary | ICD-10-CM

## 2021-08-08 DIAGNOSIS — K1123 Chronic sialoadenitis: Secondary | ICD-10-CM

## 2021-08-28 ENCOUNTER — Ambulatory Visit
Admission: RE | Admit: 2021-08-28 | Discharge: 2021-08-28 | Disposition: A | Discharge status: Home / Self Care | Payer: Medicare Other | Source: Ambulatory Visit | Attending: Otolaryngology | Admitting: Otolaryngology

## 2021-08-28 ENCOUNTER — Other Ambulatory Visit: Payer: Self-pay

## 2021-08-28 DIAGNOSIS — K116 Mucocele of salivary gland: Secondary | ICD-10-CM | POA: Diagnosis not present

## 2021-08-28 DIAGNOSIS — R220 Localized swelling, mass and lump, head: Secondary | ICD-10-CM | POA: Diagnosis not present

## 2021-08-28 DIAGNOSIS — J341 Cyst and mucocele of nose and nasal sinus: Secondary | ICD-10-CM | POA: Diagnosis not present

## 2021-08-28 DIAGNOSIS — K115 Sialolithiasis: Secondary | ICD-10-CM

## 2021-08-28 DIAGNOSIS — K1123 Chronic sialoadenitis: Secondary | ICD-10-CM

## 2021-08-28 MED ORDER — IOPAMIDOL (ISOVUE-300) INJECTION 61%
100.0000 mL | Freq: Once | INTRAVENOUS | Status: AC | PRN
  Administered 2021-08-28: 14:00:00 100 mL via INTRAVENOUS

## 2021-08-29 DIAGNOSIS — K115 Sialolithiasis: Secondary | ICD-10-CM | POA: Diagnosis not present

## 2021-08-29 DIAGNOSIS — K1123 Chronic sialoadenitis: Secondary | ICD-10-CM | POA: Diagnosis not present

## 2021-09-09 NOTE — Progress Notes (Deleted)
Cardiology Office Note:    Date:  09/09/2021   ID:  Courtnei, Ruddell [birth year], MRN [id number]  PCP:  Harlan Stains, MD   [facility] Providers Cardiologist:  Candee Furbish, MD {   Referring MD: Harlan Stains, MD    History of Present Illness:    Nicole [name] is a 88 y.o. female with a hx of sjogren syndrome, chronic diastolic heart failure, Non Hodgkins lymphoma s/p chemo and rituxan who was previously followed by Dr. Marlou Porch who now returns to clinic for follow-up.  Last saw Dr. Marlou Porch on 08/02/20 where she had recently suffered from a fall with possible syncope. She missed a step and hit her face. MRI showed  mild age-related cerebral atrophy and chronic microvascular ischemic changes.  She also had a remote right basal ganglia lacunar insult.  And also remote right middle cerebral peduncle microhemorrhage. Had cardiac monitor with Dr. Marlou Porch showed NSR, occasionally episodes of paroxymal atrial tachycardia, occasional PVCs.   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   Depression    Heartburn    IBS (irritable bowel syndrome)    Non Hodgkin's lymphoma ([place])    Personal history of colonic polyps 04/13/2007   hyperplastic   Positional vertigo    Reflux    Sjogren's syndrome ([place])     Past Surgical History:  Procedure Laterality Date   APPENDECTOMY     BREAST BIOPSY Right 1986   Benign    CATARACT EXTRACTION  2008 &2010   bilateral   iredectomy  2008   left   LUNG REMOVAL, PARTIAL  04/1999   right lung   OOPHORECTOMY  2000   SALIVARY GLAND SURGERY  01/2011 & 04/2011   TONSILLECTOMY AND ADENOIDECTOMY     VEIN SURGERY  1966    Current Medications: No outpatient medications have been marked as taking for the 09/11/21 encounter (Appointment) with Freada Bergeron, MD.     Allergies:   Ace inhibitors and Protonix [pantoprazole sodium]   Social History    Socioeconomic History   Marital status: Divorced    Spouse name: Not on file   Number of children: 5   Years of education: Not on file   Highest education level: Not on file  Occupational History   Occupation: retired Programmer, multimedia: RETIRED  Tobacco Use   Smoking status: Former   Smokeless tobacco: Never  Scientific laboratory technician Use: Never used  Substance and Sexual Activity   Alcohol use: No   Drug use: No   Sexual activity: Not on file  Other Topics Concern   Not on file  Social History Narrative   Not on file   Social Determinants of Health   Financial Resource Strain: Not on file  Food Insecurity: Not on file  Transportation Needs: Not on file  Physical Activity: Not on file  Stress: Not on file  Social Connections: Not on file     Family History: The patient's ***family history includes Celiac disease in her mother; Heart disease in her father and mother; Heart failure in her father; Kidney cancer in her daughter.  ROS:   Please see the history of present illness.    *** All other systems reviewed and are negative.  EKGs/Labs/Other Studies Reviewed:    The following studies were reviewed today: Cardiac Monitor 07/2020: Sinus rhythm 68 bpm on  average. Occasional paroxysmal atrial tachycardia episodes. Usually brief. Few seconds duration. Benign. No pauses. No ventricular tachycardia. No atrial fibrillation. Occasional premature atrial contractions. Reassuring overall monitor.   TTE 08/10/19: IMPRESSIONS     1. Left ventricular ejection fraction, by visual estimation, is 60 to  65%. The left ventricle has normal function. There is no left ventricular  hypertrophy.   2. The left ventricle has no regional wall motion abnormalities.   3. Global right ventricle has normal systolic function.The right  ventricular size is normal. No increase in right ventricular wall  thickness.   4. The mitral valve is normal in structure. No evidence of mitral valve   regurgitation.   5. The tricuspid valve is normal in structure. Tricuspid valve  regurgitation is trivial.   6. The aortic valve was not well visualized. Aortic valve regurgitation  is not visualized. No evidence of aortic valve stenosis.   7. The pulmonic valve was not well visualized. Pulmonic valve  regurgitation is not visualized.   8. Left atrial size was normal.   9. Right atrial size was normal.  10. The inferior vena cava is normal in size with greater than 50%  respiratory variability, suggesting right atrial pressure of 3 mmHg.  11. The tricuspid regurgitant velocity is 2.04 m/s, and with an assumed  right atrial pressure of 3 mmHg, the estimated right ventricular systolic  pressure is normal at 19.6 mmHg.  EKG:  EKG is *** ordered today.  The ekg ordered today demonstrates ***  Recent Labs: No results found for requested labs within last 8760 hours.  Recent Lipid Panel No results found for: CHOL, TRIG, HDL, CHOLHDL, VLDL, LDLCALC, LDLDIRECT   Risk Assessment/Calculations:   {Does this patient have ATRIAL FIBRILLATION?:[phone]}       Physical Exam:    VS:  LMP  (LMP Unknown)     Wt Readings from Last 3 Encounters:  08/02/20 150 lb (68 kg)  08/01/19 152 lb (68.9 kg)  08/01/18 152 lb 12.8 oz (69.3 kg)     GEN: *** Well nourished, well developed in no acute distress HEENT: Normal NECK: No JVD; No carotid bruits LYMPHATICS: No lymphadenopathy CARDIAC: ***RRR, no murmurs, rubs, gallops RESPIRATORY:  Clear to auscultation without rales, wheezing or rhonchi  ABDOMEN: Soft, non-tender, non-distended MUSCULOSKELETAL:  No edema; No deformity  SKIN: Warm and dry NEUROLOGIC:  Alert and oriented x 3 PSYCHIATRIC:  Normal affect   ASSESSMENT:    No diagnosis found. PLAN:    In order of problems listed above:  #Chronic Diastolic Heart Failure: Doing well. Currently euvolemic. Previous nuclear stress with no ischemia. Normal EF on TTE in 2020. -Continue metop  25mg  XL daily  #Syncope: Thought to be initially mechanical, however, patient is very concerned that she syncopized prior to hitting her head. Cardiac monitor with paroxysmal atrial tach that was brief but no other significant arrhythmias.  #HTN: -Continue HCTZ 12.5mg  daily -Continue metop 25mg  XL daily  #HLD: -Continue crestor 5mg  daily      {Are you ordering a CV Procedure (e.g. stress test, cath, DCCV, TEE, etc)?   Press F2        :[id number]}    Medication Adjustments/Labs and Tests Ordered: Current medicines are reviewed at length with the patient today.  Concerns regarding medicines are outlined above.  No orders of the defined types were placed in this encounter.  No orders of the defined types were placed in this encounter.   There are no Patient Instructions on file for  this visit.   Signed, Freada Bergeron, MD  09/09/2021 6:30 AM    [place]

## 2021-09-11 ENCOUNTER — Other Ambulatory Visit: Payer: Self-pay

## 2021-09-11 ENCOUNTER — Ambulatory Visit: Payer: Medicare Other | Admitting: Cardiology

## 2021-09-11 ENCOUNTER — Encounter: Payer: Self-pay | Admitting: Cardiology

## 2021-09-11 VITALS — BP 140/76 | HR 65 | Ht 63.0 in | Wt 150.4 lb

## 2021-09-11 DIAGNOSIS — R55 Syncope and collapse: Secondary | ICD-10-CM | POA: Diagnosis not present

## 2021-09-11 DIAGNOSIS — I10 Essential (primary) hypertension: Secondary | ICD-10-CM

## 2021-09-11 DIAGNOSIS — E7800 Pure hypercholesterolemia, unspecified: Secondary | ICD-10-CM | POA: Diagnosis not present

## 2021-09-11 DIAGNOSIS — I5032 Chronic diastolic (congestive) heart failure: Secondary | ICD-10-CM

## 2021-09-11 MED ORDER — HYDROCHLOROTHIAZIDE 12.5 MG PO CAPS: 12.5000 mg | ORAL_CAPSULE | Freq: Every day | ORAL | 4 refills | Status: DC

## 2021-09-11 MED ORDER — METOPROLOL SUCCINATE ER 25 MG PO TB24: 25.0000 mg | ORAL_TABLET | Freq: Every day | ORAL | 4 refills | Status: DC

## 2021-09-11 NOTE — Patient Instructions (Signed)
Medication Instructions:   Your physician recommends that you continue on your current medications as directed. Please refer to the Current Medication list given to you today.  *If you need a refill on your cardiac medications before your next appointment, please call your pharmacy*    Follow-Up: At [facility], you and your health needs are our priority.  As part of our continuing mission to provide you with exceptional heart care, we have created designated Provider Care Teams.  These Care Teams include your primary Cardiologist (physician) and Advanced Practice Providers (APPs -  Physician Assistants and Nurse Practitioners) who all work together to provide you with the care you need, when you need it.  We recommend signing up for the patient portal called "MyChart".  Sign up information is provided on this After Visit Summary.  MyChart is used to connect with patients for Virtual Visits (Telemedicine).  Patients are able to view lab/test results, encounter notes, upcoming appointments, etc.  Non-urgent messages can be sent to your provider as well.   To learn more about what you can do with MyChart, go to [email or web address].    Your next appointment:   1 year(s)  The format for your next appointment:   In Person  Provider:   DR. Johney Frame

## 2021-09-11 NOTE — Progress Notes (Signed)
Cardiology Office Note:    Date:  09/11/2021   ID:  Nicole [name], Nicole [name] [birth year], MRN [id number]  PCP:  Harlan Stains, MD   [facility] Providers Cardiologist:  Candee Furbish, MD {   Referring MD: Harlan Stains, MD    History of Present Illness:    Nicole [name] is a 88 y.o. female with a hx of sjogren syndrome, chronic diastolic heart failure, Non Hodgkins lymphoma s/p chemo and rituxan who was previously followed by Dr. Marlou Porch who now returns to clinic for follow-up.  Last saw Dr. Marlou Porch on 08/02/20 where she had recently suffered from a fall with possible syncope. She missed a step and hit her face. MRI showed  mild age-related cerebral atrophy and chronic microvascular ischemic changes.  She also had a remote right basal ganglia lacunar insult.  And also remote right middle cerebral peduncle microhemorrhage. Had cardiac monitor with Dr. Marlou Porch showed NSR, occasionally episodes of paroxymal atrial tachycardia, occasional PVCs.   Today, she is doing well and recently recovered from a parotid infection. She endorses a history of parotiditis infections. Otherwise, she is doing very well from a CV standpoint. No recent falls. Has occasional lightheadedness when changing positions but no syncope. She ensures she does slow position changes. Occasional SOB when walking an incline but no chest pain.  She records her blood pressure at home. Her measurements are typically 299 to 371 systolic. She is tolerating and doing well on Crestor. She has pain in her bilateral knees and feet due to arthritis. She takes CoQ10 which helps with the pain.  The patient denies chest pain, chest pressure, dyspnea at rest, PND, orthopnea, or leg swelling. Denies cough, fever, chills. Denies nausea, vomiting. Denies syncope or presyncope. Denies dizziness. Denies snoring.  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    Depression    Heartburn    IBS (irritable bowel syndrome)    Non Hodgkin's lymphoma ([place])    Personal history of colonic polyps 04/13/2007   hyperplastic   Positional vertigo    Reflux    Sjogren's syndrome [facility])     Past Surgical History:  Procedure Laterality Date   APPENDECTOMY     BREAST BIOPSY Right 1986   Benign    CATARACT EXTRACTION  2008 &2010   bilateral   iredectomy  2008   left   LUNG REMOVAL, PARTIAL  04/1999   right lung   OOPHORECTOMY  2000   SALIVARY GLAND SURGERY  01/2011 & 04/2011   TONSILLECTOMY AND ADENOIDECTOMY     VEIN SURGERY  1966    Current Medications: Current Meds  Medication Sig   aspirin 81 MG tablet Take 81 mg by mouth daily.     Biotin 1000 MCG tablet Take 1,000 mcg by mouth daily.   Calcium Carbonate-Vitamin D (CALCIUM-VITAMIN D) 600-200 MG-UNIT CAPS Take 1 tablet by mouth daily.    Cholecalciferol (VITAMIN D3) 1000 UNITS CAPS Take 1,000 Units by mouth daily.     Coenzyme Q10 (CO Q 10) 100 MG CAPS Take by mouth.   COVID-19 mRNA vaccine, Pfizer, 30 MCG/0.3ML injection Inject into the muscle.   Cyanocobalamin (B-12) 2500 MCG TABS Take by mouth every other day.   Glucosamine-Chondroitin 750-600 MG TABS Take 1 tablet by mouth daily.   meclizine (ANTIVERT) 12.5 MG tablet Take 12.5 mg by mouth every 6 (six) hours as needed for dizziness.  Methylsulfonylmethane (MSM) 1000 MG CAPS Take 1,000 mg by mouth daily.     Multiple Vitamin (MULTIVITAMIN) capsule Take 1 capsule by mouth daily.     OMEGA 3 1000 MG CAPS Take 1,000 mg by mouth 2 (two) times daily.     omeprazole (PRILOSEC) 10 MG capsule Take 10 mg by mouth every morning.   ondansetron (ZOFRAN ODT) 4 MG disintegrating tablet Take 1 tablet (4 mg total) by mouth every 8 (eight) hours as needed for nausea or vomiting.   Polyethyl Glycol-Propyl Glycol (SYSTANE OP) Apply 1 drop to eye every 4 (four) hours as needed (dry eyes). Both eyes   PREMARIN 0.45 MG tablet Take 1 tablet by mouth daily.    rosuvastatin (CRESTOR) 5 MG tablet Take 5 mg by mouth 2 (two) times a week. Tuesday and Friday   UNABLE TO FIND Take 1 capsule by mouth daily. Calcium carbonate-vitamin d-mag-zinc   vitamin C (ASCORBIC ACID) 500 MG tablet Take 500 mg by mouth daily.     [DISCONTINUED] hydrochlorothiazide (MICROZIDE) 12.5 MG capsule Take 1 capsule by mouth once daily   [DISCONTINUED] metoprolol succinate (TOPROL-XL) 25 MG 24 hr tablet Take 1 tablet (25 mg total) by mouth daily.     Allergies:   Ace inhibitors and Protonix [pantoprazole sodium]   Social History   Socioeconomic History   Marital status: Divorced    Spouse name: Not on file   Number of children: 5   Years of education: Not on file   Highest education level: Not on file  Occupational History   Occupation: retired Programmer, multimedia: RETIRED  Tobacco Use   Smoking status: Former   Smokeless tobacco: Never  Scientific laboratory technician Use: Never used  Substance and Sexual Activity   Alcohol use: No   Drug use: No   Sexual activity: Not on file  Other Topics Concern   Not on file  Social History Narrative   Not on file   Social Determinants of Health   Financial Resource Strain: Not on file  Food Insecurity: Not on file  Transportation Needs: Not on file  Physical Activity: Not on file  Stress: Not on file  Social Connections: Not on file     Family History: The patient's family history includes Celiac disease in her mother; Heart disease in her father and mother; Heart failure in her father; Kidney cancer in her daughter.  ROS:   Review of Systems  Constitutional:  Negative for chills and fever.  HENT:  Negative for congestion and ear pain.   Eyes:  Negative for blurred vision and pain.  Respiratory:  Positive for shortness of breath (with exertion). Negative for cough.   Cardiovascular:  Positive for palpitations. Negative for chest pain, orthopnea, claudication, leg swelling and PND.  Gastrointestinal:  Negative for heartburn and  nausea.  Genitourinary:  Negative for frequency and urgency.  Musculoskeletal:  Positive for joint pain. Negative for myalgias.  Skin:  Negative for itching and rash.  Neurological:  Positive for dizziness. Negative for headaches.  Endo/Heme/Allergies:  Does not bruise/bleed easily.  Psychiatric/Behavioral:  The patient does not have insomnia.     EKGs/Labs/Other Studies Reviewed:    The following studies were reviewed today: Cardiac Monitor 07/2020: Sinus rhythm 68 bpm on average. Occasional paroxysmal atrial tachycardia episodes. Usually brief. Few seconds duration. Benign. No pauses. No ventricular tachycardia. No atrial fibrillation. Occasional premature atrial contractions. Reassuring overall monitor.   TTE 08/10/19: IMPRESSIONS   1. Left ventricular ejection  fraction, by visual estimation, is 60 to  65%. The left ventricle has normal function. There is no left ventricular  hypertrophy.   2. The left ventricle has no regional wall motion abnormalities.   3. Global right ventricle has normal systolic function.The right  ventricular size is normal. No increase in right ventricular wall  thickness.   4. The mitral valve is normal in structure. No evidence of mitral valve  regurgitation.   5. The tricuspid valve is normal in structure. Tricuspid valve  regurgitation is trivial.   6. The aortic valve was not well visualized. Aortic valve regurgitation  is not visualized. No evidence of aortic valve stenosis.   7. The pulmonic valve was not well visualized. Pulmonic valve  regurgitation is not visualized.   8. Left atrial size was normal.   9. Right atrial size was normal.  10. The inferior vena cava is normal in size with greater than 50%  respiratory variability, suggesting right atrial pressure of 3 mmHg.  11. The tricuspid regurgitant velocity is 2.04 m/s, and with an assumed  right atrial pressure of 3 mmHg, the estimated right ventricular systolic  pressure is normal at  19.6 mmHg.  EKG:   09/11/21: Sinus rhythm, rate 65 bpm  Recent Labs: No results found for requested labs within last 8760 hours.  Recent Lipid Panel No results found for: CHOL, TRIG, HDL, CHOLHDL, VLDL, LDLCALC, LDLDIRECT   Risk Assessment/Calculations:           Physical Exam:    VS:  BP 140/76    Pulse 65    Ht 5\' 3"  (1.6 m)    Wt 150 lb 6.4 oz (68.2 kg)    LMP  (LMP Unknown)    SpO2 96%    BMI 26.64 kg/m     Wt Readings from Last 3 Encounters:  09/11/21 150 lb 6.4 oz (68.2 kg)  08/02/20 150 lb (68 kg)  08/01/19 152 lb (68.9 kg)     GEN: Well nourished, well developed in no acute distress HEENT: Normal NECK: No JVD; No carotid bruits CARDIAC: RRR, 1/6 systolic murmur at RUSB, no rubs or gallops RESPIRATORY:  Clear to auscultation without rales, wheezing or rhonchi ABDOMEN: Soft, non-tender, non-distended MUSCULOSKELETAL:  No edema; No deformity  SKIN: Warm and dry NEUROLOGIC:  Alert and oriented x 3 PSYCHIATRIC:  Normal affect   ASSESSMENT:    1. Chronic diastolic heart failure ([place])   2. Essential hypertension   3. Syncope and collapse   4. Pure hypercholesterolemia    PLAN:    In order of problems listed above:  #Chronic Diastolic Heart Failure: Doing well. Currently euvolemic. Previous nuclear stress with no ischemia. Normal EF on TTE in 2020. -Continue metop 25mg  XL daily -Continue HCTZ 12.5mg  daily -Low Na diet  #Syncope: Thought to be initially mechanical, however, patient is very concerned that she syncopized prior to hitting her head. Cardiac monitor with paroxysmal atrial tach that was brief but no other significant arrhythmias. No recurrence of symptoms. -Continue to monitor  #HTN: Slightly elevated at home but also with orthostasis and history of falls. Will continue current regimen for now to avoid low blood pressure.  -Continue HCTZ 12.5mg  daily -Continue metop 25mg  XL daily -If orthostasis worsens, can stop HCTZ  #HLD: Very well  controlled with LDL 59. -Continue crestor 5mg  daily           Medication Adjustments/Labs and Tests Ordered: Current medicines are reviewed at length with the patient today.  Concerns  regarding medicines are outlined above.  Orders Placed This Encounter  Procedures   EKG 12-Lead   Meds ordered this encounter  Medications   hydrochlorothiazide (MICROZIDE) 12.5 MG capsule    Sig: Take 1 capsule (12.5 mg total) by mouth daily.    Dispense:  90 capsule    Refill:  4   metoprolol succinate (TOPROL-XL) 25 MG 24 hr tablet    Sig: Take 1 tablet (25 mg total) by mouth daily.    Dispense:  90 tablet    Refill:  4    Patient Instructions  Medication Instructions:   Your physician recommends that you continue on your current medications as directed. Please refer to the Current Medication list given to you today.  *If you need a refill on your cardiac medications before your next appointment, please call your pharmacy*    Follow-Up: At [facility], you and your health needs are our priority.  As part of our continuing mission to provide you with exceptional heart care, we have created designated Provider Care Teams.  These Care Teams include your primary Cardiologist (physician) and Advanced Practice Providers (APPs -  Physician Assistants and Nurse Practitioners) who all work together to provide you with the care you need, when you need it.  We recommend signing up for the patient portal called "MyChart".  Sign up information is provided on this After Visit Summary.  MyChart is used to connect with patients for Virtual Visits (Telemedicine).  Patients are able to view lab/test results, encounter notes, upcoming appointments, etc.  Non-urgent messages can be sent to your provider as well.   To learn more about what you can do with MyChart, go to [email or web address].    Your next appointment:   1 year(s)  The format for your next appointment:   In Person  Provider:   DR.  Reece Leader as a scribe for Freada Bergeron, MD.,have documented all relevant documentation on the behalf of Freada Bergeron, MD,as directed by  Freada Bergeron, MD while in the presence of Freada Bergeron, MD.  I, Freada Bergeron, MD, have reviewed all documentation for this visit. The documentation on 09/11/21 for the exam, diagnosis, procedures, and orders are all accurate and complete.   Signed, Freada Bergeron, MD  09/11/2021 8:26 AM    [place]

## 2021-09-22 DIAGNOSIS — K115 Sialolithiasis: Secondary | ICD-10-CM | POA: Diagnosis not present

## 2021-09-22 DIAGNOSIS — C8200 Follicular lymphoma grade I, unspecified site: Secondary | ICD-10-CM | POA: Diagnosis not present

## 2021-09-22 DIAGNOSIS — C8580 Other specified types of non-Hodgkin lymphoma, unspecified site: Secondary | ICD-10-CM | POA: Diagnosis not present

## 2021-10-01 DIAGNOSIS — K118 Other diseases of salivary glands: Secondary | ICD-10-CM | POA: Diagnosis not present

## 2021-10-01 DIAGNOSIS — R918 Other nonspecific abnormal finding of lung field: Secondary | ICD-10-CM | POA: Diagnosis not present

## 2021-10-01 DIAGNOSIS — R930 Abnormal findings on diagnostic imaging of skull and head, not elsewhere classified: Secondary | ICD-10-CM | POA: Diagnosis not present

## 2021-10-01 DIAGNOSIS — Z8579 Personal history of other malignant neoplasms of lymphoid, hematopoietic and related tissues: Secondary | ICD-10-CM | POA: Diagnosis not present

## 2021-10-01 DIAGNOSIS — R9389 Abnormal findings on diagnostic imaging of other specified body structures: Secondary | ICD-10-CM | POA: Diagnosis not present

## 2021-10-01 DIAGNOSIS — R911 Solitary pulmonary nodule: Secondary | ICD-10-CM | POA: Diagnosis not present

## 2021-10-14 DIAGNOSIS — H40013 Open angle with borderline findings, low risk, bilateral: Secondary | ICD-10-CM | POA: Diagnosis not present

## 2021-10-16 DIAGNOSIS — R911 Solitary pulmonary nodule: Secondary | ICD-10-CM | POA: Diagnosis not present

## 2021-11-07 DIAGNOSIS — Z87891 Personal history of nicotine dependence: Secondary | ICD-10-CM | POA: Diagnosis not present

## 2021-11-07 DIAGNOSIS — R911 Solitary pulmonary nodule: Secondary | ICD-10-CM | POA: Diagnosis not present

## 2021-11-07 DIAGNOSIS — Z8572 Personal history of non-Hodgkin lymphomas: Secondary | ICD-10-CM | POA: Diagnosis not present

## 2021-11-20 DIAGNOSIS — N390 Urinary tract infection, site not specified: Secondary | ICD-10-CM | POA: Diagnosis not present

## 2021-11-20 DIAGNOSIS — K118 Other diseases of salivary glands: Secondary | ICD-10-CM | POA: Diagnosis not present

## 2021-11-20 DIAGNOSIS — R8290 Unspecified abnormal findings in urine: Secondary | ICD-10-CM | POA: Diagnosis not present

## 2021-11-20 DIAGNOSIS — C8590 Non-Hodgkin lymphoma, unspecified, unspecified site: Secondary | ICD-10-CM | POA: Diagnosis not present

## 2021-11-28 DIAGNOSIS — R911 Solitary pulmonary nodule: Secondary | ICD-10-CM | POA: Diagnosis not present

## 2021-12-02 DIAGNOSIS — L821 Other seborrheic keratosis: Secondary | ICD-10-CM | POA: Diagnosis not present

## 2021-12-02 DIAGNOSIS — L570 Actinic keratosis: Secondary | ICD-10-CM | POA: Diagnosis not present

## 2021-12-02 DIAGNOSIS — Z85828 Personal history of other malignant neoplasm of skin: Secondary | ICD-10-CM | POA: Diagnosis not present

## 2021-12-02 DIAGNOSIS — D225 Melanocytic nevi of trunk: Secondary | ICD-10-CM | POA: Diagnosis not present

## 2021-12-02 DIAGNOSIS — D692 Other nonthrombocytopenic purpura: Secondary | ICD-10-CM | POA: Diagnosis not present

## 2021-12-02 DIAGNOSIS — D224 Melanocytic nevi of scalp and neck: Secondary | ICD-10-CM | POA: Diagnosis not present

## 2021-12-02 DIAGNOSIS — D2261 Melanocytic nevi of right upper limb, including shoulder: Secondary | ICD-10-CM | POA: Diagnosis not present

## 2021-12-03 DIAGNOSIS — R911 Solitary pulmonary nodule: Secondary | ICD-10-CM | POA: Diagnosis not present

## 2021-12-03 DIAGNOSIS — C884 Extranodal marginal zone B-cell lymphoma of mucosa-associated lymphoid tissue [MALT-lymphoma]: Secondary | ICD-10-CM | POA: Diagnosis not present

## 2021-12-18 DIAGNOSIS — C8200 Follicular lymphoma grade I, unspecified site: Secondary | ICD-10-CM | POA: Diagnosis not present

## 2021-12-18 DIAGNOSIS — Z1159 Encounter for screening for other viral diseases: Secondary | ICD-10-CM | POA: Diagnosis not present

## 2021-12-18 DIAGNOSIS — Z79620 Long term (current) use of immunosuppressive biologic: Secondary | ICD-10-CM | POA: Diagnosis not present

## 2021-12-18 DIAGNOSIS — Z79899 Other long term (current) drug therapy: Secondary | ICD-10-CM | POA: Diagnosis not present

## 2021-12-18 DIAGNOSIS — C884 Extranodal marginal zone B-cell lymphoma of mucosa-associated lymphoid tissue [MALT-lymphoma]: Secondary | ICD-10-CM | POA: Diagnosis not present

## 2021-12-18 DIAGNOSIS — R911 Solitary pulmonary nodule: Secondary | ICD-10-CM | POA: Diagnosis not present

## 2021-12-25 DIAGNOSIS — C8589 Other specified types of non-Hodgkin lymphoma, extranodal and solid organ sites: Secondary | ICD-10-CM | POA: Diagnosis not present

## 2021-12-25 DIAGNOSIS — Z5111 Encounter for antineoplastic chemotherapy: Secondary | ICD-10-CM | POA: Diagnosis not present

## 2022-01-01 DIAGNOSIS — Z5111 Encounter for antineoplastic chemotherapy: Secondary | ICD-10-CM | POA: Diagnosis not present

## 2022-01-01 DIAGNOSIS — C884 Extranodal marginal zone B-cell lymphoma of mucosa-associated lymphoid tissue [MALT-lymphoma]: Secondary | ICD-10-CM | POA: Diagnosis not present

## 2022-01-08 DIAGNOSIS — Z79899 Other long term (current) drug therapy: Secondary | ICD-10-CM | POA: Diagnosis not present

## 2022-01-08 DIAGNOSIS — M2500 Hemarthrosis, unspecified joint: Secondary | ICD-10-CM | POA: Diagnosis not present

## 2022-01-08 DIAGNOSIS — R300 Dysuria: Secondary | ICD-10-CM | POA: Diagnosis not present

## 2022-01-08 DIAGNOSIS — Z5112 Encounter for antineoplastic immunotherapy: Secondary | ICD-10-CM | POA: Diagnosis not present

## 2022-01-08 DIAGNOSIS — Z87440 Personal history of urinary (tract) infections: Secondary | ICD-10-CM | POA: Diagnosis not present

## 2022-01-08 DIAGNOSIS — C8580 Other specified types of non-Hodgkin lymphoma, unspecified site: Secondary | ICD-10-CM | POA: Diagnosis not present

## 2022-01-08 DIAGNOSIS — C884 Extranodal marginal zone B-cell lymphoma of mucosa-associated lymphoid tissue [MALT-lymphoma]: Secondary | ICD-10-CM | POA: Diagnosis not present

## 2022-01-08 DIAGNOSIS — Z79620 Long term (current) use of immunosuppressive biologic: Secondary | ICD-10-CM | POA: Diagnosis not present

## 2022-03-04 DIAGNOSIS — C8208 Follicular lymphoma grade I, lymph nodes of multiple sites: Secondary | ICD-10-CM | POA: Diagnosis not present

## 2022-03-04 DIAGNOSIS — C8200 Follicular lymphoma grade I, unspecified site: Secondary | ICD-10-CM | POA: Diagnosis not present

## 2022-03-04 DIAGNOSIS — R911 Solitary pulmonary nodule: Secondary | ICD-10-CM | POA: Diagnosis not present

## 2022-03-05 DIAGNOSIS — I10 Essential (primary) hypertension: Secondary | ICD-10-CM | POA: Diagnosis not present

## 2022-03-05 DIAGNOSIS — K219 Gastro-esophageal reflux disease without esophagitis: Secondary | ICD-10-CM | POA: Diagnosis not present

## 2022-03-05 DIAGNOSIS — I5032 Chronic diastolic (congestive) heart failure: Secondary | ICD-10-CM | POA: Diagnosis not present

## 2022-03-09 DIAGNOSIS — Z712 Person consulting for explanation of examination or test findings: Secondary | ICD-10-CM | POA: Diagnosis not present

## 2022-03-09 DIAGNOSIS — C884 Extranodal marginal zone B-cell lymphoma of mucosa-associated lymphoid tissue [MALT-lymphoma]: Secondary | ICD-10-CM | POA: Diagnosis not present

## 2022-03-09 DIAGNOSIS — C8200 Follicular lymphoma grade I, unspecified site: Secondary | ICD-10-CM | POA: Diagnosis not present

## 2022-04-23 DIAGNOSIS — H524 Presbyopia: Secondary | ICD-10-CM | POA: Diagnosis not present

## 2022-04-23 DIAGNOSIS — H35361 Drusen (degenerative) of macula, right eye: Secondary | ICD-10-CM | POA: Diagnosis not present

## 2022-04-23 DIAGNOSIS — H40013 Open angle with borderline findings, low risk, bilateral: Secondary | ICD-10-CM | POA: Diagnosis not present

## 2022-04-23 DIAGNOSIS — H04123 Dry eye syndrome of bilateral lacrimal glands: Secondary | ICD-10-CM | POA: Diagnosis not present

## 2022-06-08 DIAGNOSIS — R634 Abnormal weight loss: Secondary | ICD-10-CM | POA: Diagnosis not present

## 2022-06-08 DIAGNOSIS — C8200 Follicular lymphoma grade I, unspecified site: Secondary | ICD-10-CM | POA: Diagnosis not present

## 2022-06-09 DIAGNOSIS — K580 Irritable bowel syndrome with diarrhea: Secondary | ICD-10-CM | POA: Diagnosis not present

## 2022-06-09 DIAGNOSIS — I10 Essential (primary) hypertension: Secondary | ICD-10-CM | POA: Diagnosis not present

## 2022-06-09 DIAGNOSIS — K219 Gastro-esophageal reflux disease without esophagitis: Secondary | ICD-10-CM | POA: Diagnosis not present

## 2022-07-07 DIAGNOSIS — C8590 Non-Hodgkin lymphoma, unspecified, unspecified site: Secondary | ICD-10-CM | POA: Diagnosis not present

## 2022-07-07 DIAGNOSIS — I10 Essential (primary) hypertension: Secondary | ICD-10-CM | POA: Diagnosis not present

## 2022-07-07 DIAGNOSIS — Z8673 Personal history of transient ischemic attack (TIA), and cerebral infarction without residual deficits: Secondary | ICD-10-CM | POA: Diagnosis not present

## 2022-07-07 DIAGNOSIS — K580 Irritable bowel syndrome with diarrhea: Secondary | ICD-10-CM | POA: Diagnosis not present

## 2022-07-07 DIAGNOSIS — Z79899 Other long term (current) drug therapy: Secondary | ICD-10-CM | POA: Diagnosis not present

## 2022-07-07 DIAGNOSIS — Z0000 Encounter for general adult medical examination without abnormal findings: Secondary | ICD-10-CM | POA: Diagnosis not present

## 2022-07-07 DIAGNOSIS — K219 Gastro-esophageal reflux disease without esophagitis: Secondary | ICD-10-CM | POA: Diagnosis not present

## 2022-07-07 DIAGNOSIS — M8588 Other specified disorders of bone density and structure, other site: Secondary | ICD-10-CM | POA: Diagnosis not present

## 2022-07-07 DIAGNOSIS — G250 Essential tremor: Secondary | ICD-10-CM | POA: Diagnosis not present

## 2022-07-07 DIAGNOSIS — I5032 Chronic diastolic (congestive) heart failure: Secondary | ICD-10-CM | POA: Diagnosis not present

## 2022-09-04 DIAGNOSIS — K7689 Other specified diseases of liver: Secondary | ICD-10-CM | POA: Diagnosis not present

## 2022-09-04 DIAGNOSIS — R918 Other nonspecific abnormal finding of lung field: Secondary | ICD-10-CM | POA: Diagnosis not present

## 2022-09-04 DIAGNOSIS — C8200 Follicular lymphoma grade I, unspecified site: Secondary | ICD-10-CM | POA: Diagnosis not present

## 2022-09-04 DIAGNOSIS — K118 Other diseases of salivary glands: Secondary | ICD-10-CM | POA: Diagnosis not present

## 2022-09-07 DIAGNOSIS — C8200 Follicular lymphoma grade I, unspecified site: Secondary | ICD-10-CM | POA: Diagnosis not present

## 2022-09-08 DIAGNOSIS — K219 Gastro-esophageal reflux disease without esophagitis: Secondary | ICD-10-CM | POA: Diagnosis not present

## 2022-09-08 DIAGNOSIS — I5032 Chronic diastolic (congestive) heart failure: Secondary | ICD-10-CM | POA: Diagnosis not present

## 2022-09-08 DIAGNOSIS — I10 Essential (primary) hypertension: Secondary | ICD-10-CM | POA: Diagnosis not present

## 2022-09-14 NOTE — Progress Notes (Deleted)
Cardiology Office Note:    Date:  09/14/2022   ID:  Nicole [name], Nicole [name] 03-30-1933, MRN [id number]  PCP:  Nicole Stains, MD   [facility] Providers Cardiologist:  Nicole Furbish, MD {   Referring MD: Nicole Stains, MD    History of Present Illness:    Nicole [name] is a 89 y.o. female with a hx of sjogren syndrome, chronic diastolic heart failure, Non Hodgkins lymphoma s/p chemo and rituxan who was previously followed by Dr. Marlou [name] who now returns to clinic for follow-up.  Last saw Dr. Marlou [name] on 08/02/20 where she had recently suffered from a fall with possible syncope. She missed a step and hit her face. MRI showed  mild age-related cerebral atrophy and chronic microvascular ischemic changes.  She also had a remote right basal ganglia lacunar insult.  And also remote right middle cerebral peduncle microhemorrhage. Had cardiac monitor with Dr. Marlou [name] showed NSR, occasionally episodes of paroxymal atrial tachycardia, occasional PVCs.  Was last seen in clinic on 09/11/21 where she was recovering from a parotid infection. Was otherwise doing well from a CV standpoint.     Today, ***  Past Medical History:  Diagnosis Date   Abscess of parotid gland 01/2011   w/parotid stones   Cancer (Nicole [name]) 1992   NHL--BALT   Depression    Heartburn    IBS (irritable bowel syndrome)    Non Hodgkin's lymphoma (Nicole [name])    Personal history of colonic polyps 04/13/2007   hyperplastic   Positional vertigo    Reflux    Sjogren's syndrome ([place])     Past Surgical History:  Procedure Laterality Date   APPENDECTOMY     BREAST BIOPSY Right 1986   Benign    CATARACT EXTRACTION  2008 &2010   bilateral   iredectomy  2008   left   LUNG REMOVAL, PARTIAL  04/1999   right lung   OOPHORECTOMY  2000   SALIVARY GLAND SURGERY  01/2011 & 04/2011   TONSILLECTOMY AND ADENOIDECTOMY     VEIN SURGERY  1966    Current Medications: No outpatient medications have been marked as taking for the 09/16/22 encounter  (Appointment) with Nicole Bergeron, MD.     Allergies:   Ace inhibitors and Protonix [pantoprazole sodium]   Social History   Socioeconomic History   Marital status: Divorced    Spouse name: Not on file   Number of children: 5   Years of education: Not on file   Highest education level: Not on file  Occupational History   Occupation: retired Programmer, multimedia: RETIRED  Tobacco Use   Smoking status: Former   Smokeless tobacco: Never  Scientific laboratory technician Use: Never used  Substance and Sexual Activity   Alcohol use: No   Drug use: No   Sexual activity: Not on file  Other Topics Concern   Not on file  Social History Narrative   Not on file   Social Determinants of Health   Financial Resource Strain: Not on file  Food Insecurity: Not on file  Transportation Needs: Not on file  Physical Activity: Not on file  Stress: Not on file  Social Connections: Not on file     Family History: The patient's family history includes Celiac disease in her mother; Heart disease in her father and mother; Heart failure in her father; Kidney cancer in her daughter.  ROS:   Review of Systems  Constitutional:  Negative for chills and fever.  HENT:  Negative for congestion and ear pain.   Eyes:  Negative for blurred vision and pain.  Respiratory:  Positive for shortness of breath (with exertion). Negative for cough.   Cardiovascular:  Positive for palpitations. Negative for chest pain, orthopnea, claudication, leg swelling and PND.  Gastrointestinal:  Negative for heartburn and nausea.  Genitourinary:  Negative for frequency and urgency.  Musculoskeletal:  Positive for joint pain. Negative for myalgias.  Skin:  Negative for itching and rash.  Neurological:  Positive for dizziness. Negative for headaches.  Endo/Heme/Allergies:  Does not bruise/bleed easily.  Psychiatric/Behavioral:  The patient does not have insomnia.      EKGs/Labs/Other Studies Reviewed:    The following studies  were reviewed today: Cardiac Monitor 07/2020: Sinus rhythm 68 bpm on average. Occasional paroxysmal atrial tachycardia episodes. Usually brief. Few seconds duration. Benign. No pauses. No ventricular tachycardia. No atrial fibrillation. Occasional premature atrial contractions. Reassuring overall monitor.   TTE 08/10/19: IMPRESSIONS   1. Left ventricular ejection fraction, by visual estimation, is 60 to  65%. The left ventricle has normal function. There is no left ventricular  hypertrophy.   2. The left ventricle has no regional wall motion abnormalities.   3. Global right ventricle has normal systolic function.The right  ventricular size is normal. No increase in right ventricular wall  thickness.   4. The mitral valve is normal in structure. No evidence of mitral valve  regurgitation.   5. The tricuspid valve is normal in structure. Tricuspid valve  regurgitation is trivial.   6. The aortic valve was not well visualized. Aortic valve regurgitation  is not visualized. No evidence of aortic valve stenosis.   7. The pulmonic valve was not well visualized. Pulmonic valve  regurgitation is not visualized.   8. Left atrial size was normal.   9. Right atrial size was normal.  10. The inferior vena cava is normal in size with greater than 50%  respiratory variability, suggesting right atrial pressure of 3 mmHg.  11. The tricuspid regurgitant velocity is 2.04 m/s, and with an assumed  right atrial pressure of 3 mmHg, the estimated right ventricular systolic  pressure is normal at 19.6 mmHg.  EKG:   09/11/21: Sinus rhythm, rate 65 bpm  Recent Labs: No results found for requested labs within last 365 days.  Recent Lipid Panel No results found for: "CHOL", "TRIG", "HDL", "CHOLHDL", "VLDL", "LDLCALC", "LDLDIRECT"   Risk Assessment/Calculations:           Physical Exam:    VS:  LMP  (LMP Unknown)     Wt Readings from Last 3 Encounters:  09/11/21 150 lb 6.4 oz (68.2 kg)   08/02/20 150 lb (68 kg)  08/01/19 152 lb (68.9 kg)     GEN: Well nourished, well developed in no acute distress HEENT: Normal NECK: No JVD; No carotid bruits CARDIAC: RRR, 1/6 systolic murmur at RUSB, no rubs or gallops RESPIRATORY:  Clear to auscultation without rales, wheezing or rhonchi ABDOMEN: Soft, non-tender, non-distended MUSCULOSKELETAL:  No edema; No deformity  SKIN: Warm and dry NEUROLOGIC:  Alert and oriented x 3 PSYCHIATRIC:  Normal affect   ASSESSMENT:    No diagnosis found.  PLAN:    In order of problems listed above:  #Chronic Diastolic Heart Failure: Doing well. Currently euvolemic. Previous nuclear stress with no ischemia. Normal EF on TTE in 2020. -Continue metop '25mg'$  XL daily -Continue HCTZ 12.'5mg'$  daily -Low Na diet  #Syncope: Thought to be initially mechanical, however, patient is very concerned that  she syncopized prior to hitting her head. Cardiac monitor with paroxysmal atrial tach that was brief but no other significant arrhythmias. No recurrence of symptoms. -Continue to monitor  #HTN: Slightly elevated at home but also with orthostasis and history of falls. Will continue current regimen for now to avoid low blood pressure.  -Continue HCTZ 12.'5mg'$  daily -Continue metop '25mg'$  XL daily -If orthostasis worsens, can stop HCTZ  #HLD: *** -Continue crestor '5mg'$  daily           Medication Adjustments/Labs and Tests Ordered: Current medicines are reviewed at length with the patient today.  Concerns regarding medicines are outlined above.  No orders of the defined types were placed in this encounter.  No orders of the defined types were placed in this encounter.   There are no Patient Instructions on file for this visit.    Signed, Nicole Bergeron, MD  09/14/2022 2:38 PM    [place]

## 2022-09-16 ENCOUNTER — Ambulatory Visit: Payer: Medicare Other | Admitting: Cardiology

## 2022-09-16 ENCOUNTER — Ambulatory Visit: Payer: PPO | Attending: Cardiology | Admitting: Cardiology

## 2022-09-16 ENCOUNTER — Encounter: Payer: Self-pay | Admitting: Cardiology

## 2022-09-16 VITALS — BP 150/72 | HR 57 | Ht 63.0 in | Wt 145.0 lb

## 2022-09-16 DIAGNOSIS — I5032 Chronic diastolic (congestive) heart failure: Secondary | ICD-10-CM

## 2022-09-16 DIAGNOSIS — Z79899 Other long term (current) drug therapy: Secondary | ICD-10-CM | POA: Diagnosis not present

## 2022-09-16 DIAGNOSIS — I10 Essential (primary) hypertension: Secondary | ICD-10-CM

## 2022-09-16 MED ORDER — POTASSIUM CHLORIDE CRYS ER 20 MEQ PO TBCR: 20.0000 meq | EXTENDED_RELEASE_TABLET | Freq: Every day | ORAL | 1 refills | Status: DC

## 2022-09-16 MED ORDER — METOPROLOL SUCCINATE ER 25 MG PO TB24: 25.0000 mg | ORAL_TABLET | Freq: Every day | ORAL | 4 refills | Status: DC

## 2022-09-16 NOTE — Progress Notes (Signed)
Cardiology Office Note:    Date:  09/16/2022   ID:  Zannah, Melucci [birth year], MRN [id number]  PCP:  Harlan Stains, MD   [facility] Providers Cardiologist:  Candee Furbish, MD {  Referring MD: Harlan Stains, MD    History of Present Illness:    GLEMA TAKAKI is a 89 y.o. female with a hx of sjogren syndrome, chronic diastolic heart failure, Non Hodgkins lymphoma s/p chemo and rituxan who was previously followed by Dr. Marlou Porch who now returns to clinic for follow-up.  Last saw Dr. Marlou Porch on 08/02/20 where she had recently suffered from a fall with possible syncope. She missed a step and hit her face. MRI showed  mild age-related cerebral atrophy and chronic microvascular ischemic changes.  She also had a remote right basal ganglia lacunar insult.  And also remote right middle cerebral peduncle microhemorrhage. Had cardiac monitor with Dr. Marlou Porch showed NSR, occasionally episodes of paroxymal atrial tachycardia, occasional PVCs.  Was last seen in clinic on 09/11/21 where she was recovering from a parotid infection. Was otherwise doing well from a CV standpoint.     Today, the patient states that in August 2023 she had a home visit and her blood pressure was elevated. Her PCP was contacted and HCTZ was increased to 25 mg. Currently she takes two 12.5 mg tablets for a total of 25 mg.  Within the past 1-2 months she has been feeling a couple episodes of flutters in her chest. She tried to detect them via her radial pulse but was unable to; she only feels them in her chest. Generally her palpitations are always occurring while sitting and typically last for a couple minutes. No exertional symptoms and no dizziness associated with the palpitations. However, she does endorse vertigo and positional dizziness/lightheadedness. Once in a while she has become hypotensive such as standing up after kneeling down in church. No syncope.  She denies any chest pain, headaches, syncope, orthopnea,  or PND.   Past Medical History:  Diagnosis Date   Abscess of parotid gland 01/2011   w/parotid stones   Cancer ([place]) 1992   NHL--BALT   Depression    Heartburn    IBS (irritable bowel syndrome)    Non Hodgkin's lymphoma ([place])    Personal history of colonic polyps 04/13/2007   hyperplastic   Positional vertigo    Reflux    Sjogren's syndrome [facility])     Past Surgical History:  Procedure Laterality Date   APPENDECTOMY     BREAST BIOPSY Right 1986   Benign    CATARACT EXTRACTION  2008 &2010   bilateral   iredectomy  2008   left   LUNG REMOVAL, PARTIAL  04/1999   right lung   OOPHORECTOMY  2000   SALIVARY GLAND SURGERY  01/2011 & 04/2011   TONSILLECTOMY AND ADENOIDECTOMY     VEIN SURGERY  1966    Current Medications: Current Meds  Medication Sig   aspirin 81 MG tablet Take 81 mg by mouth daily.     Biotin 1000 MCG tablet Take 1,000 mcg by mouth daily.   Calcium Carbonate-Vitamin D (CALCIUM-VITAMIN D) 600-200 MG-UNIT CAPS Take 1 tablet by mouth daily.    Cholecalciferol (VITAMIN D3) 1000 UNITS CAPS Take 1,000 Units by mouth daily.     Coenzyme Q10 (CO Q 10) 100 MG CAPS Take by mouth.   COVID-19 mRNA vaccine, Pfizer, 30 MCG/0.3ML injection Inject into the muscle.   Cyanocobalamin (B-12) 2500 MCG TABS Take by mouth  every other day.   Glucosamine-Chondroitin 750-600 MG TABS Take 2 tablets by mouth daily.   hydrochlorothiazide (MICROZIDE) 12.5 MG capsule Take 1 capsule (12.5 mg total) by mouth daily. (Patient taking differently: Take 25 mg by mouth daily.)   meclizine (ANTIVERT) 12.5 MG tablet Take 12.5 mg by mouth every 6 (six) hours as needed for dizziness.   Methylsulfonylmethane (MSM) 1000 MG CAPS Take 1,000 mg by mouth daily.     metoprolol succinate (TOPROL-XL) 25 MG 24 hr tablet Take 1 tablet (25 mg total) by mouth daily.   Multiple Vitamin (MULTIVITAMIN) capsule Take 1 capsule by mouth daily.     OMEGA 3 1000 MG CAPS Take 1,000 mg by mouth 2 (two) times daily.      omeprazole (PRILOSEC) 10 MG capsule Take 10 mg by mouth every other day.   ondansetron (ZOFRAN ODT) 4 MG disintegrating tablet Take 1 tablet (4 mg total) by mouth every 8 (eight) hours as needed for nausea or vomiting.   Polyethyl Glycol-Propyl Glycol (SYSTANE OP) Apply 1 drop to eye every 4 (four) hours as needed (dry eyes). Both eyes   potassium chloride SA (KLOR-CON M) 20 MEQ tablet Take 1 tablet (20 mEq total) by mouth daily.   PREMARIN 0.45 MG tablet Take 1 tablet by mouth every other day.   rosuvastatin (CRESTOR) 5 MG tablet Take 5 mg by mouth 2 (two) times a week. Tuesday and Friday   UNABLE TO FIND Take 1 capsule by mouth daily. Calcium carbonate-vitamin d-mag-zinc   vitamin C (ASCORBIC ACID) 500 MG tablet Take 500 mg by mouth daily.       Allergies:   Ace inhibitors and Protonix [pantoprazole sodium]   Social History   Socioeconomic History   Marital status: Divorced    Spouse name: Not on file   Number of children: 5   Years of education: Not on file   Highest education level: Not on file  Occupational History   Occupation: retired Programmer, multimedia: RETIRED  Tobacco Use   Smoking status: Former   Smokeless tobacco: Never  Scientific laboratory technician Use: Never used  Substance and Sexual Activity   Alcohol use: No   Drug use: No   Sexual activity: Not on file  Other Topics Concern   Not on file  Social History Narrative   Not on file   Social Determinants of Health   Financial Resource Strain: Not on file  Food Insecurity: Not on file  Transportation Needs: Not on file  Physical Activity: Not on file  Stress: Not on file  Social Connections: Not on file     Family History: The patient's family history includes Celiac disease in her mother; Heart disease in her father and mother; Heart failure in her father; Kidney cancer in her daughter.  ROS:   Review of Systems  Constitutional:  Negative for chills and fever.  HENT:  Negative for congestion and ear pain.   Eyes:   Negative for blurred vision and pain.  Respiratory:  Positive for shortness of breath (with exertion). Negative for cough.   Cardiovascular:  Positive for palpitations. Negative for chest pain, orthopnea, claudication, leg swelling and PND.  Gastrointestinal:  Negative for heartburn and nausea.  Genitourinary:  Negative for frequency and urgency.  Musculoskeletal:  Positive for joint pain. Negative for myalgias.  Skin:  Negative for itching and rash.  Neurological:  Positive for dizziness. Negative for headaches.  Endo/Heme/Allergies:  Does not bruise/bleed easily.  Psychiatric/Behavioral:  The patient does not have insomnia.      EKGs/Labs/Other Studies Reviewed:    The following studies were reviewed today:  CT Neck/Thyroid  09/04/2022  ([place]): CONCLUSION:  1. No enlarged or morphologically suspicious lymph nodes in the neck.  2.  Progressive cystic changes and mineralization involving the right parotid tail, likely relating to chronic inflammation.   CT Chest/Abdomen/Pelvis  09/04/2022  ([place]): Findings: .  Heart/vessel: Normal heart size. No pericardial effusion. Aorta normal in caliber and appearance. No pulmonary embolism.  .  Vascular: Calcified aortoiliac atherosclerosis.   CONCLUSION:  1. No enlarged lymph nodes in the chest, abdomen, or pelvis.  2.  Further evolution and decreased size of a left upper lobe lesion, present since 2014 favored to reflect an infectious/inflammatory etiology.  3.  Please see contemporaneous CT neck for further evaluation.   Cardiac Monitor 07/2020: Sinus rhythm 68 bpm on average. Occasional paroxysmal atrial tachycardia episodes. Usually brief. Few seconds duration. Benign. No pauses. No ventricular tachycardia. No atrial fibrillation. Occasional premature atrial contractions. Reassuring overall monitor.   TTE 08/10/19: IMPRESSIONS   1. Left ventricular ejection fraction, by visual estimation, is 60 to  65%. The  left ventricle has normal function. There is no left ventricular  hypertrophy.   2. The left ventricle has no regional wall motion abnormalities.   3. Global right ventricle has normal systolic function.The right  ventricular size is normal. No increase in right ventricular wall  thickness.   4. The mitral valve is normal in structure. No evidence of mitral valve  regurgitation.   5. The tricuspid valve is normal in structure. Tricuspid valve  regurgitation is trivial.   6. The aortic valve was not well visualized. Aortic valve regurgitation  is not visualized. No evidence of aortic valve stenosis.   7. The pulmonic valve was not well visualized. Pulmonic valve  regurgitation is not visualized.   8. Left atrial size was normal.   9. Right atrial size was normal.  10. The inferior vena cava is normal in size with greater than 50%  respiratory variability, suggesting right atrial pressure of 3 mmHg.  11. The tricuspid regurgitant velocity is 2.04 m/s, and with an assumed  right atrial pressure of 3 mmHg, the estimated right ventricular systolic  pressure is normal at 19.6 mmHg.  EKG:  EKG is personally reviewed. 09/16/2022:  Sinus bradycardia. Rate 57 bpm. 09/11/21: Sinus rhythm, rate 65 bpm  Recent Labs: No results found for requested labs within last 365 days.   Recent Lipid Panel No results found for: "CHOL", "TRIG", "HDL", "CHOLHDL", "VLDL", "LDLCALC", "LDLDIRECT"   Risk Assessment/Calculations:           Physical Exam:    VS:  BP (!) 150/72   Pulse (!) 57   Ht '5\' 3"'$  (1.6 m)   Wt 145 lb (65.8 kg)   LMP  (LMP Unknown)   SpO2 98%   BMI 25.69 kg/m     Wt Readings from Last 3 Encounters:  09/16/22 145 lb (65.8 kg)  09/11/21 150 lb 6.4 oz (68.2 kg)  08/02/20 150 lb (68 kg)     GEN: Well nourished, well developed in no acute distress HEENT: Normal NECK: No JVD; No carotid bruits CARDIAC: Bradycardic, 1/6 systolic murmur at RUSB, no rubs or gallops RESPIRATORY:   Clear to auscultation without rales, wheezing or rhonchi ABDOMEN: Soft, non-tender, non-distended MUSCULOSKELETAL:  No edema; No deformity  SKIN: Warm and dry NEUROLOGIC:  Alert and oriented  x 3 PSYCHIATRIC:  Normal affect   ASSESSMENT:    1. Chronic diastolic heart failure ([place])   2. Essential hypertension   3. Medication management     PLAN:    In order of problems listed above:  #Chronic Diastolic Heart Failure: Doing well. Currently euvolemic. Previous nuclear stress with no ischemia. Normal EF on TTE in 2020. -Continue metop '25mg'$  XL daily -Continue HCTZ '25mg'$  daily -Low Na diet  #Syncope: Thought to be initially mechanical, however, patient is very concerned that she syncopized prior to hitting her head. Cardiac monitor with paroxysmal atrial tach that was brief but no other significant arrhythmias. No recurrence of symptoms. -Continue to monitor  #HTN: Slightly elevated at home but also with orthostasis and history of falls. Will continue current regimen for now to avoid low blood pressure.  -Continue HCTZ '25mg'$  daily -Continue metop '25mg'$  XL daily -If orthostasis worsens, can stop HCTZ  #HLD: -Continue crestor '5mg'$  daily -Follow-up with PCP  #Hypokalemia: Likely due to HCTZ. Will start potassium 71mq daily and repeat BMET in 2 weeks.          Follow-up:  6 months.  Medication Adjustments/Labs and Tests Ordered: Current medicines are reviewed at length with the patient today.  Concerns regarding medicines are outlined above.   Orders Placed This Encounter  Procedures   Basic metabolic panel   EKG 122-LNLG  Meds ordered this encounter  Medications   potassium chloride SA (KLOR-CON M) 20 MEQ tablet    Sig: Take 1 tablet (20 mEq total) by mouth daily.    Dispense:  90 tablet    Refill:  1   Patient Instructions  Medication Instructions:   START TAKING POTASSIUM CHLORIDE 20 mEq BY MOUTH DAILY  *If you need a refill on your cardiac medications before  your next appointment, please call your pharmacy*   Lab Work:  IN 2 WEEKS HERE IN THE OFFICE--BMET--PLEASE BRING YOUR BLOOD PRESSURE RECORDINGS TO THIS APPOINTMENT AND DROP THIS OFF TO THE FRONT DESK GIRLS TO GET TO DR. PJohney Frame  If you have labs (blood work) drawn today and your tests are completely normal, you will receive your results only by: [place](if you have MyChart) OR A paper copy in the mail If you have any lab test that is abnormal or we need to change your treatment, we will call you to review the results.    Follow-Up: [facility] and your health needs are our priority.  As part of our continuing mission to provide you with exceptional heart care, we have created designated Provider Care Teams.  These Care Teams include your primary Cardiologist (physician) and Advanced Practice Providers (APPs -  Physician Assistants and Nurse Practitioners) who all work together to provide you with the care you need, when you need it.  We recommend signing up for the patient portal called "MyChart".  Sign up information is provided on this After Visit Summary.  MyChart is used to connect with patients for Virtual Visits (Telemedicine).  Patients are able to view lab/test results, encounter notes, upcoming appointments, etc.  Non-urgent messages can be sent to your provider as well.   To learn more about what you can do with MyChart, go to [email or web address]    Your next appointment:   6 MONTHS WITH DR. PJohney Frame  Other Instructions  BRING YOUR BLOOD PRESSURE RECORDINGS TO YOUR LAB APPOINTMENT IN 2 WEEKS    I,Mathew Stumpf,acting as a scribe for [employer]  MD.,have documented all relevant documentation on the behalf of Freada Bergeron, MD,as directed by  Freada Bergeron, MD while in the presence of Freada Bergeron, MD.  I, Freada Bergeron, MD, have reviewed all documentation for this visit. The documentation on 09/16/22 for the  exam, diagnosis, procedures, and orders are all accurate and complete.    Signed, Freada Bergeron, MD  09/16/2022 2:50 PM    [place]

## 2022-09-16 NOTE — Patient Instructions (Signed)
Medication Instructions:   START TAKING POTASSIUM CHLORIDE 20 mEq BY MOUTH DAILY  *If you need a refill on your cardiac medications before your next appointment, please call your pharmacy*   Lab Work:  IN 2 WEEKS HERE IN THE OFFICE--BMET--PLEASE BRING YOUR BLOOD PRESSURE RECORDINGS TO THIS APPOINTMENT AND DROP THIS OFF TO THE FRONT DESK GIRLS TO GET TO DR. Johney Frame   If you have labs (blood work) drawn today and your tests are completely normal, you will receive your results only by: [place] (if you have MyChart) OR A paper copy in the mail If you have any lab test that is abnormal or we need to change your treatment, we will call you to review the results.    Follow-Up: At [facility], you and your health needs are our priority.  As part of our continuing mission to provide you with exceptional heart care, we have created designated Provider Care Teams.  These Care Teams include your primary Cardiologist (physician) and Advanced Practice Providers (APPs -  Physician Assistants and Nurse Practitioners) who all work together to provide you with the care you need, when you need it.  We recommend signing up for the patient portal called "MyChart".  Sign up information is provided on this After Visit Summary.  MyChart is used to connect with patients for Virtual Visits (Telemedicine).  Patients are able to view lab/test results, encounter notes, upcoming appointments, etc.  Non-urgent messages can be sent to your provider as well.   To learn more about what you can do with MyChart, go to [email or web address].    Your next appointment:   6 MONTHS WITH DR. Johney Frame   Other Instructions  BRING YOUR BLOOD PRESSURE RECORDINGS TO YOUR LAB APPOINTMENT IN 2 WEEKS

## 2022-09-23 DIAGNOSIS — B349 Viral infection, unspecified: Secondary | ICD-10-CM | POA: Diagnosis not present

## 2022-09-23 DIAGNOSIS — J9801 Acute bronchospasm: Secondary | ICD-10-CM | POA: Diagnosis not present

## 2022-09-30 ENCOUNTER — Ambulatory Visit: Payer: PPO | Attending: Cardiology

## 2022-09-30 DIAGNOSIS — Z79899 Other long term (current) drug therapy: Secondary | ICD-10-CM

## 2022-09-30 DIAGNOSIS — I5032 Chronic diastolic (congestive) heart failure: Secondary | ICD-10-CM | POA: Diagnosis not present

## 2022-09-30 DIAGNOSIS — I10 Essential (primary) hypertension: Secondary | ICD-10-CM | POA: Diagnosis not present

## 2022-10-01 LAB — BASIC METABOLIC PANEL
BUN/Creatinine Ratio: 20 (ref 12–28)
BUN: 16 mg/dL (ref 8–27)
CO2: 23 mmol/L (ref 20–29)
Calcium: 9.6 mg/dL (ref 8.7–10.3)
Chloride: 93 mmol/L — ABNORMAL LOW (ref 96–106)
Creatinine, Ser: 0.81 mg/dL (ref 0.57–1.00)
Glucose: 98 mg/dL (ref 70–99)
Potassium: 4.4 mmol/L (ref 3.5–5.2)
Sodium: 132 mmol/L — ABNORMAL LOW (ref 134–144)
eGFR: 70 mL/min/{1.73_m2} (ref >59)

## 2022-10-13 DIAGNOSIS — H40013 Open angle with borderline findings, low risk, bilateral: Secondary | ICD-10-CM | POA: Diagnosis not present

## 2022-10-13 DIAGNOSIS — H524 Presbyopia: Secondary | ICD-10-CM | POA: Diagnosis not present

## 2022-10-30 ENCOUNTER — Telehealth: Payer: Self-pay | Admitting: Cardiology

## 2022-10-30 DIAGNOSIS — I10 Essential (primary) hypertension: Secondary | ICD-10-CM

## 2022-10-30 MED ORDER — HYDROCHLOROTHIAZIDE 25 MG PO TABS: 25.0000 mg | ORAL_TABLET | Freq: Every day | ORAL | 3 refills | Status: AC

## 2022-10-30 NOTE — Telephone Encounter (Signed)
Pt c/o medication issue:  1. Name of Medication: hydrochlorothiazide (MICROZIDE) 12.5 MG capsule   2. How are you currently taking this medication (dosage and times per day)? Take 1 capsule (12.5 mg total) by mouth daily.Patient taking differently: Take 25 mg by mouth daily.   3. Are you having a reaction (difficulty breathing--STAT)? No  4. What is your medication issue? Pt requesting '25mg'$  tablet instead of above dosage as discussed at last visit on 09/16/22. Once requested dosage is approved, pt would like a refill sent to pharmacy on file. Please advise   Pt on has 2 tablets left.

## 2022-11-04 DIAGNOSIS — H40012 Open angle with borderline findings, low risk, left eye: Secondary | ICD-10-CM | POA: Diagnosis not present

## 2022-11-30 DIAGNOSIS — H524 Presbyopia: Secondary | ICD-10-CM | POA: Diagnosis not present

## 2022-11-30 DIAGNOSIS — H40011 Open angle with borderline findings, low risk, right eye: Secondary | ICD-10-CM | POA: Diagnosis not present

## 2022-11-30 DIAGNOSIS — H401121 Primary open-angle glaucoma, left eye, mild stage: Secondary | ICD-10-CM | POA: Diagnosis not present

## 2022-12-09 DIAGNOSIS — Z85828 Personal history of other malignant neoplasm of skin: Secondary | ICD-10-CM | POA: Diagnosis not present

## 2022-12-09 DIAGNOSIS — L282 Other prurigo: Secondary | ICD-10-CM | POA: Diagnosis not present

## 2022-12-09 DIAGNOSIS — L300 Nummular dermatitis: Secondary | ICD-10-CM | POA: Diagnosis not present

## 2023-01-04 DIAGNOSIS — C8200 Follicular lymphoma grade I, unspecified site: Secondary | ICD-10-CM | POA: Diagnosis not present

## 2023-01-04 DIAGNOSIS — C8590 Non-Hodgkin lymphoma, unspecified, unspecified site: Secondary | ICD-10-CM | POA: Diagnosis not present

## 2023-01-08 DIAGNOSIS — F4321 Adjustment disorder with depressed mood: Secondary | ICD-10-CM | POA: Diagnosis not present

## 2023-01-08 DIAGNOSIS — F4322 Adjustment disorder with anxiety: Secondary | ICD-10-CM | POA: Diagnosis not present

## 2023-01-08 DIAGNOSIS — M25471 Effusion, right ankle: Secondary | ICD-10-CM | POA: Diagnosis not present

## 2023-01-08 DIAGNOSIS — I110 Hypertensive heart disease with heart failure: Secondary | ICD-10-CM | POA: Diagnosis not present

## 2023-01-08 DIAGNOSIS — Z8673 Personal history of transient ischemic attack (TIA), and cerebral infarction without residual deficits: Secondary | ICD-10-CM | POA: Diagnosis not present

## 2023-01-08 DIAGNOSIS — I5032 Chronic diastolic (congestive) heart failure: Secondary | ICD-10-CM | POA: Diagnosis not present

## 2023-01-08 DIAGNOSIS — C8590 Non-Hodgkin lymphoma, unspecified, unspecified site: Secondary | ICD-10-CM | POA: Diagnosis not present

## 2023-01-08 DIAGNOSIS — H8111 Benign paroxysmal vertigo, right ear: Secondary | ICD-10-CM | POA: Diagnosis not present

## 2023-01-20 DIAGNOSIS — H8110 Benign paroxysmal vertigo, unspecified ear: Secondary | ICD-10-CM | POA: Diagnosis not present

## 2023-01-20 DIAGNOSIS — R2681 Unsteadiness on feet: Secondary | ICD-10-CM | POA: Diagnosis not present

## 2023-01-27 DIAGNOSIS — L821 Other seborrheic keratosis: Secondary | ICD-10-CM | POA: Diagnosis not present

## 2023-01-27 DIAGNOSIS — I788 Other diseases of capillaries: Secondary | ICD-10-CM | POA: Diagnosis not present

## 2023-01-27 DIAGNOSIS — D2271 Melanocytic nevi of right lower limb, including hip: Secondary | ICD-10-CM | POA: Diagnosis not present

## 2023-01-27 DIAGNOSIS — L309 Dermatitis, unspecified: Secondary | ICD-10-CM | POA: Diagnosis not present

## 2023-01-27 DIAGNOSIS — D692 Other nonthrombocytopenic purpura: Secondary | ICD-10-CM | POA: Diagnosis not present

## 2023-01-27 DIAGNOSIS — D1801 Hemangioma of skin and subcutaneous tissue: Secondary | ICD-10-CM | POA: Diagnosis not present

## 2023-01-27 DIAGNOSIS — L820 Inflamed seborrheic keratosis: Secondary | ICD-10-CM | POA: Diagnosis not present

## 2023-01-27 DIAGNOSIS — Z85828 Personal history of other malignant neoplasm of skin: Secondary | ICD-10-CM | POA: Diagnosis not present

## 2023-01-27 DIAGNOSIS — L300 Nummular dermatitis: Secondary | ICD-10-CM | POA: Diagnosis not present

## 2023-01-27 DIAGNOSIS — L570 Actinic keratosis: Secondary | ICD-10-CM | POA: Diagnosis not present

## 2023-01-27 DIAGNOSIS — D225 Melanocytic nevi of trunk: Secondary | ICD-10-CM | POA: Diagnosis not present

## 2023-02-03 DIAGNOSIS — C8590 Non-Hodgkin lymphoma, unspecified, unspecified site: Secondary | ICD-10-CM | POA: Diagnosis not present

## 2023-02-03 DIAGNOSIS — Z7982 Long term (current) use of aspirin: Secondary | ICD-10-CM | POA: Diagnosis not present

## 2023-02-03 DIAGNOSIS — K219 Gastro-esophageal reflux disease without esophagitis: Secondary | ICD-10-CM | POA: Diagnosis not present

## 2023-02-03 DIAGNOSIS — M1990 Unspecified osteoarthritis, unspecified site: Secondary | ICD-10-CM | POA: Diagnosis not present

## 2023-02-03 DIAGNOSIS — E559 Vitamin D deficiency, unspecified: Secondary | ICD-10-CM | POA: Diagnosis not present

## 2023-02-03 DIAGNOSIS — I10 Essential (primary) hypertension: Secondary | ICD-10-CM | POA: Diagnosis not present

## 2023-02-03 DIAGNOSIS — E785 Hyperlipidemia, unspecified: Secondary | ICD-10-CM | POA: Diagnosis not present

## 2023-02-03 DIAGNOSIS — N951 Menopausal and female climacteric states: Secondary | ICD-10-CM | POA: Diagnosis not present

## 2023-02-03 DIAGNOSIS — Z9849 Cataract extraction status, unspecified eye: Secondary | ICD-10-CM | POA: Diagnosis not present

## 2023-02-03 DIAGNOSIS — Z8673 Personal history of transient ischemic attack (TIA), and cerebral infarction without residual deficits: Secondary | ICD-10-CM | POA: Diagnosis not present

## 2023-02-03 DIAGNOSIS — E876 Hypokalemia: Secondary | ICD-10-CM | POA: Diagnosis not present

## 2023-02-04 DIAGNOSIS — H8110 Benign paroxysmal vertigo, unspecified ear: Secondary | ICD-10-CM | POA: Diagnosis not present

## 2023-02-04 DIAGNOSIS — R2681 Unsteadiness on feet: Secondary | ICD-10-CM | POA: Diagnosis not present

## 2023-03-08 ENCOUNTER — Other Ambulatory Visit: Payer: Self-pay

## 2023-03-08 DIAGNOSIS — I10 Essential (primary) hypertension: Secondary | ICD-10-CM

## 2023-03-08 DIAGNOSIS — I5032 Chronic diastolic (congestive) heart failure: Secondary | ICD-10-CM

## 2023-03-08 DIAGNOSIS — Z79899 Other long term (current) drug therapy: Secondary | ICD-10-CM

## 2023-03-08 MED ORDER — POTASSIUM CHLORIDE CRYS ER 20 MEQ PO TBCR: 20.0000 meq | EXTENDED_RELEASE_TABLET | Freq: Every day | ORAL | 1 refills | Status: DC

## 2023-04-27 DIAGNOSIS — H40011 Open angle with borderline findings, low risk, right eye: Secondary | ICD-10-CM | POA: Diagnosis not present

## 2023-04-27 DIAGNOSIS — H401121 Primary open-angle glaucoma, left eye, mild stage: Secondary | ICD-10-CM | POA: Diagnosis not present

## 2023-04-27 DIAGNOSIS — H35363 Drusen (degenerative) of macula, bilateral: Secondary | ICD-10-CM | POA: Diagnosis not present

## 2023-04-27 DIAGNOSIS — H35373 Puckering of macula, bilateral: Secondary | ICD-10-CM | POA: Diagnosis not present

## 2023-05-05 DIAGNOSIS — K449 Diaphragmatic hernia without obstruction or gangrene: Secondary | ICD-10-CM | POA: Diagnosis not present

## 2023-05-05 DIAGNOSIS — C8590 Non-Hodgkin lymphoma, unspecified, unspecified site: Secondary | ICD-10-CM | POA: Diagnosis not present

## 2023-05-05 DIAGNOSIS — R918 Other nonspecific abnormal finding of lung field: Secondary | ICD-10-CM | POA: Diagnosis not present

## 2023-05-05 DIAGNOSIS — K7689 Other specified diseases of liver: Secondary | ICD-10-CM | POA: Diagnosis not present

## 2023-05-06 DIAGNOSIS — C8590 Non-Hodgkin lymphoma, unspecified, unspecified site: Secondary | ICD-10-CM | POA: Diagnosis not present

## 2023-05-28 DIAGNOSIS — M1990 Unspecified osteoarthritis, unspecified site: Secondary | ICD-10-CM | POA: Diagnosis not present

## 2023-06-30 DIAGNOSIS — M1712 Unilateral primary osteoarthritis, left knee: Secondary | ICD-10-CM | POA: Diagnosis not present

## 2023-07-15 DIAGNOSIS — K219 Gastro-esophageal reflux disease without esophagitis: Secondary | ICD-10-CM | POA: Diagnosis not present

## 2023-07-15 DIAGNOSIS — I10 Essential (primary) hypertension: Secondary | ICD-10-CM | POA: Diagnosis not present

## 2023-07-15 DIAGNOSIS — Z79899 Other long term (current) drug therapy: Secondary | ICD-10-CM | POA: Diagnosis not present

## 2023-07-19 DIAGNOSIS — Z8673 Personal history of transient ischemic attack (TIA), and cerebral infarction without residual deficits: Secondary | ICD-10-CM | POA: Diagnosis not present

## 2023-07-19 DIAGNOSIS — F4322 Adjustment disorder with anxiety: Secondary | ICD-10-CM | POA: Diagnosis not present

## 2023-07-19 DIAGNOSIS — I10 Essential (primary) hypertension: Secondary | ICD-10-CM | POA: Diagnosis not present

## 2023-07-19 DIAGNOSIS — H8110 Benign paroxysmal vertigo, unspecified ear: Secondary | ICD-10-CM | POA: Diagnosis not present

## 2023-07-19 DIAGNOSIS — K580 Irritable bowel syndrome with diarrhea: Secondary | ICD-10-CM | POA: Diagnosis not present

## 2023-07-19 DIAGNOSIS — C8599 Non-Hodgkin lymphoma, unspecified, extranodal and solid organ sites: Secondary | ICD-10-CM | POA: Diagnosis not present

## 2023-07-19 DIAGNOSIS — Z0000 Encounter for general adult medical examination without abnormal findings: Secondary | ICD-10-CM | POA: Diagnosis not present

## 2023-07-19 DIAGNOSIS — J309 Allergic rhinitis, unspecified: Secondary | ICD-10-CM | POA: Diagnosis not present

## 2023-07-19 DIAGNOSIS — I5032 Chronic diastolic (congestive) heart failure: Secondary | ICD-10-CM | POA: Diagnosis not present

## 2023-07-19 DIAGNOSIS — H401121 Primary open-angle glaucoma, left eye, mild stage: Secondary | ICD-10-CM | POA: Diagnosis not present

## 2023-07-19 DIAGNOSIS — F4321 Adjustment disorder with depressed mood: Secondary | ICD-10-CM | POA: Diagnosis not present

## 2023-07-19 DIAGNOSIS — I7090 Unspecified atherosclerosis: Secondary | ICD-10-CM | POA: Diagnosis not present

## 2023-07-22 DIAGNOSIS — N958 Other specified menopausal and perimenopausal disorders: Secondary | ICD-10-CM | POA: Diagnosis not present

## 2023-07-22 DIAGNOSIS — E2839 Other primary ovarian failure: Secondary | ICD-10-CM | POA: Diagnosis not present

## 2023-08-04 DIAGNOSIS — M1712 Unilateral primary osteoarthritis, left knee: Secondary | ICD-10-CM | POA: Diagnosis not present

## 2023-08-04 DIAGNOSIS — M170 Bilateral primary osteoarthritis of knee: Secondary | ICD-10-CM | POA: Diagnosis not present

## 2023-08-04 DIAGNOSIS — M1711 Unilateral primary osteoarthritis, right knee: Secondary | ICD-10-CM | POA: Diagnosis not present

## 2023-08-11 DIAGNOSIS — M1712 Unilateral primary osteoarthritis, left knee: Secondary | ICD-10-CM | POA: Diagnosis not present

## 2023-08-17 DIAGNOSIS — M25561 Pain in right knee: Secondary | ICD-10-CM | POA: Diagnosis not present

## 2023-08-17 DIAGNOSIS — M25562 Pain in left knee: Secondary | ICD-10-CM | POA: Diagnosis not present

## 2023-08-17 DIAGNOSIS — M6281 Muscle weakness (generalized): Secondary | ICD-10-CM | POA: Diagnosis not present

## 2023-08-17 NOTE — Progress Notes (Signed)
Cardiology Office Note:    Date:  08/23/2023   ID:  Nicole [name], Nicole [name] 1933/07/21, MRN [id number]  PCP:  Laurann Montana, MD   [facility] Providers Cardiologist:  Donato Schultz, MD {  Referring MD: Laurann Montana, MD    History of Present Illness:    Nicole [name] is a 89 y.o. female with a hx of sjogren syndrome, chronic diastolic heart failure, Non Hodgkins lymphoma s/p chemo and rituxan who was previously followed by Dr. Anne Fu and Shari Prows New to me   Saw Dr. Anne Fu on 08/02/20 where she had recently suffered from a fall with possible syncope. She missed a step and hit her face. MRI showed  mild age-related cerebral atrophy and chronic microvascular ischemic changes.  She also had a remote right basal ganglia lacunar insult.  And also remote right middle cerebral peduncle microhemorrhage. Had cardiac monitor with Dr. Anne Fu showed NSR, occasionally episodes of paroxymal atrial tachycardia, occasional PVCs.    August 2023 primary increased diuretic for BP  Complained of palpitations at rest lasting minutes on office visit 09/16/22 Once in a while she has become hypotensive such as standing up after kneeling down in church. No syncope.  She denies any chest pain, headaches, syncope, orthopnea, or PND.  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   Depression    Heartburn    IBS (irritable bowel syndrome)    Non Hodgkin's lymphoma (HCC)    Personal history of colonic polyps 04/13/2007   hyperplastic   Positional vertigo    Reflux    Sjogren's syndrome (HCC)     Past Surgical History:  Procedure Laterality Date   APPENDECTOMY     BREAST BIOPSY Right 1986   Benign    CATARACT EXTRACTION  2008 &2010   bilateral   iredectomy  2008   left   LUNG REMOVAL, PARTIAL  04/1999   right lung   OOPHORECTOMY  2000   SALIVARY GLAND  SURGERY  01/2011 & 04/2011   TONSILLECTOMY AND ADENOIDECTOMY     VEIN SURGERY  1966    Current Medications: Current Meds  Medication Sig   aspirin 81 MG tablet Take 81 mg by mouth daily.     Biotin 1000 MCG tablet Take 1,000 mcg by mouth daily.   Calcium Carbonate-Vitamin D (CALCIUM-VITAMIN D) 600-200 MG-UNIT CAPS Take 1 tablet by mouth daily.    Cholecalciferol (VITAMIN D3) 1000 UNITS CAPS Take 1,000 Units by mouth daily.     Cyanocobalamin (B-12) 2500 MCG TABS Take 1 tablet by mouth once a week.   Glucosamine-Chondroitin 750-600 MG TABS Take 2 tablets by mouth daily.   hydrochlorothiazide (HYDRODIURIL) 25 MG tablet Take 1 tablet (25 mg total) by mouth daily.   meclizine (ANTIVERT) 12.5 MG tablet Take 12.5 mg by mouth every 6 (six) hours as needed for dizziness.   Methylsulfonylmethane (MSM) 1000 MG CAPS Take 1,000 mg by mouth daily.     metoprolol succinate (TOPROL-XL) 25 MG 24 hr tablet Take 1 tablet (25 mg total) by mouth daily.   Multiple Vitamin (MULTIVITAMIN) capsule Take 1 capsule by mouth daily.     OMEGA 3 1000 MG CAPS Take 1,000 mg by mouth 2 (two) times daily.     omeprazole (PRILOSEC) 10 MG capsule Take 10 mg by mouth every other day.   Polyethyl  Glycol-Propyl Glycol (SYSTANE OP) Apply 1 drop to eye every 4 (four) hours as needed (dry eyes). Both eyes   potassium chloride SA (KLOR-CON M) 20 MEQ tablet Take 1 tablet (20 mEq total) by mouth daily.   PREMARIN 0.45 MG tablet Take 1 tablet by mouth every other day.   rosuvastatin (CRESTOR) 5 MG tablet Take 5 mg by mouth 2 (two) times a week. Tuesday and Friday   UNABLE TO FIND Take 1 capsule by mouth daily. Calcium carbonate-vitamin d-mag-zinc   vitamin C (ASCORBIC ACID) 500 MG tablet Take 500 mg by mouth daily.       Allergies:   Ace inhibitors and Protonix [pantoprazole sodium]   Social History   Socioeconomic History   Marital status: Divorced    Spouse name: Not on file   Number of children: 5   Years of education: Not  on file   Highest education level: Not on file  Occupational History   Occupation: retired Teacher, adult education: RETIRED  Tobacco Use   Smoking status: Former   Smokeless tobacco: Never  Building services engineer status: Never Used  Substance and Sexual Activity   Alcohol use: No   Drug use: No   Sexual activity: Not on file  Other Topics Concern   Not on file  Social History Narrative   Not on file   Social Drivers of Health   Financial Resource Strain: Not on file  Food Insecurity: Not on file  Transportation Needs: Not on file  Physical Activity: Not on file  Stress: Not on file  Social Connections: Not on file     Family History: The patient's family history includes Celiac disease in her mother; Heart disease in her father and mother; Heart failure in her father; Kidney cancer in her daughter.  ROS:   Review of Systems  Constitutional:  Negative for chills and fever.  HENT:  Negative for congestion and ear pain.   Eyes:  Negative for blurred vision and pain.  Respiratory:  Positive for shortness of breath (with exertion). Negative for cough.   Cardiovascular:  Positive for palpitations. Negative for chest pain, orthopnea, claudication, leg swelling and PND.  Gastrointestinal:  Negative for heartburn and nausea.  Genitourinary:  Negative for frequency and urgency.  Musculoskeletal:  Positive for joint pain. Negative for myalgias.  Skin:  Negative for itching and rash.  Neurological:  Positive for dizziness. Negative for headaches.  Endo/Heme/Allergies:  Does not bruise/bleed easily.  Psychiatric/Behavioral:  The patient does not have insomnia.      EKGs/Labs/Other Studies Reviewed:    The following studies were reviewed today:  CT Neck/Thyroid  09/04/2022  ([facility]): CONCLUSION:  1. No enlarged or morphologically suspicious lymph nodes in the neck.  2.  Progressive cystic changes and mineralization involving the right parotid tail, likely relating to chronic  inflammation.   CT Chest/Abdomen/Pelvis  09/04/2022  ([facility]): Findings: .  Heart/vessel: Normal heart size. No pericardial effusion. Aorta normal in caliber and appearance. No pulmonary embolism.  .  Vascular: Calcified aortoiliac atherosclerosis.   CONCLUSION:  1. No enlarged lymph nodes in the chest, abdomen, or pelvis.  2.  Further evolution and decreased size of a left upper lobe lesion, present since 2014 favored to reflect an infectious/inflammatory etiology.  3.  Please see contemporaneous CT neck for further evaluation.   Cardiac Monitor 07/2020: Sinus rhythm 68 bpm on average. Occasional paroxysmal atrial tachycardia episodes. Usually brief. Few seconds duration. Benign. No  pauses. No ventricular tachycardia. No atrial fibrillation. Occasional premature atrial contractions. Reassuring overall monitor.   TTE 08/10/19: IMPRESSIONS   1. Left ventricular ejection fraction, by visual estimation, is 60 to  65%. The left ventricle has normal function. There is no left ventricular  hypertrophy.   2. The left ventricle has no regional wall motion abnormalities.   3. Global right ventricle has normal systolic function.The right  ventricular size is normal. No increase in right ventricular wall  thickness.   4. The mitral valve is normal in structure. No evidence of mitral valve  regurgitation.   5. The tricuspid valve is normal in structure. Tricuspid valve  regurgitation is trivial.   6. The aortic valve was not well visualized. Aortic valve regurgitation  is not visualized. No evidence of aortic valve stenosis.   7. The pulmonic valve was not well visualized. Pulmonic valve  regurgitation is not visualized.   8. Left atrial size was normal.   9. Right atrial size was normal.  10. The inferior vena cava is normal in size with greater than 50%  respiratory variability, suggesting right atrial pressure of 3 mmHg.  11. The tricuspid regurgitant velocity is 2.04 m/s, and  with an assumed  right atrial pressure of 3 mmHg, the estimated right ventricular systolic  pressure is normal at 19.6 mmHg.  EKG:  EKG is personally reviewed. 09/16/2022:  Sinus bradycardia. Rate 57 bpm. 09/11/21: Sinus rhythm, rate 65 bpm  Recent Labs: 09/30/2022: BUN 16; Creatinine, Ser 0.81; Potassium 4.4; Sodium 132   Recent Lipid Panel No results found for: "CHOL", "TRIG", "HDL", "CHOLHDL", "VLDL", "LDLCALC", "LDLDIRECT"    Physical Exam:    VS:  BP (!) 142/58 (BP Location: Left Arm, Patient Position: Sitting, Cuff Size: Normal)   Pulse 60   Resp 18   Ht 5\' 3"  (1.6 m)   Wt 145 lb 3.2 oz (65.9 kg)   LMP  (LMP Unknown)   SpO2 99%   BMI 25.72 kg/m     Wt Readings from Last 3 Encounters:  08/23/23 145 lb 3.2 oz (65.9 kg)  09/16/22 145 lb (65.8 kg)  09/11/21 150 lb 6.4 oz (68.2 kg)    Affect appropriate Elderly female  HEENT: normal Neck supple with no adenopathy JVP normal no bruits no thyromegaly Lungs clear with no wheezing and good diaphragmatic motion Heart:  S1/S2 3/6 SEM murmur, no rub, gallop or click PMI normal Abdomen: benighn, BS positve, no tenderness, no AAA no bruit.  No HSM or HJR Distal pulses intact with no bruits No edema Neuro non-focal Skin warm and dry No muscular weakness   ASSESSMENT:    1. Essential hypertension   2. Chronic diastolic heart failure (HCC)   3. Pure hypercholesterolemia      PLAN:    In order of problems listed above:  #Chronic Diastolic Heart Failure: Doing well. Currently euvolemic. Previous nuclear stress with no ischemia. Normal EF on TTE in 2020. Update TTE has SEM as well likely aortic sclerosis  -Continue metop 25mg  XL daily -Continue HCTZ 25mg  daily -Low Na diet  #Syncope: Thought to be initially mechanical, however, patient is very concerned that she syncopized prior to hitting her head. Cardiac monitor with paroxysmal atrial tach that was brief but no other significant arrhythmias. No recurrence of  symptoms. -Continue to monitor  #HTN: Slightly elevated at home but also with orthostasis and history of falls. Will continue current regimen for now to avoid low blood pressure.  -Continue HCTZ 25mg  daily -Continue  metop 25mg  XL daily -If orthostasis worsens, can stop HCTZ  #HLD: -Continue crestor 5mg  daily -Follow-up with PCP  #Hypokalemia: Likely due to HCTZ. Improved with Kdur replacement BMET      TTE  BMET    Follow-up  in a year   Medication Adjustments/Labs and Tests Ordered: Current medicines are reviewed at length with the patient today.  Concerns regarding medicines are outlined above.   Orders Placed This Encounter  Procedures   EKG 12-Lead   No orders of the defined types were placed in this encounter.  There are no Patient Instructions on file for this visit.  I,Mathew Stumpf,acting as a Neurosurgeon for Charlton Haws, MD.,have documented all relevant documentation on the behalf of Charlton Haws, MD,as directed by  Charlton Haws, MD while in the presence of Charlton Haws, MD.  I, Charlton Haws, MD, have reviewed all documentation for this visit. The documentation on 08/23/23 for the exam, diagnosis, procedures, and orders are all accurate and complete.    Signed, Charlton Haws, MD  08/23/2023 10:35 AM    [facility]

## 2023-08-18 DIAGNOSIS — M1712 Unilateral primary osteoarthritis, left knee: Secondary | ICD-10-CM | POA: Diagnosis not present

## 2023-08-23 ENCOUNTER — Ambulatory Visit: Payer: HMO | Attending: Cardiovascular Disease | Admitting: Cardiovascular Disease

## 2023-08-23 ENCOUNTER — Encounter: Payer: Self-pay | Admitting: Cardiovascular Disease

## 2023-08-23 VITALS — BP 142/58 | HR 60 | Resp 18 | Ht 63.0 in | Wt 145.2 lb

## 2023-08-23 DIAGNOSIS — I10 Essential (primary) hypertension: Secondary | ICD-10-CM | POA: Diagnosis not present

## 2023-08-23 DIAGNOSIS — I5032 Chronic diastolic (congestive) heart failure: Secondary | ICD-10-CM | POA: Diagnosis not present

## 2023-08-23 DIAGNOSIS — E7800 Pure hypercholesterolemia, unspecified: Secondary | ICD-10-CM

## 2023-08-23 NOTE — Patient Instructions (Signed)
Medication Instructions:  Your physician recommends that you continue on your current medications as directed. Please refer to the Current Medication list given to you today.  *If you need a refill on your cardiac medications before your next appointment, please call your pharmacy*  Lab Work: Your physician recommends that you have lab work for [employer] at [employer]  If you have labs (blood work) drawn today and your tests are completely normal, you will receive your results only by: [employer] (if you have MyChart) OR A paper copy in the mail If you have any lab test that is abnormal or we need to change your treatment, we will call you to review the results.  Testing/Procedures: Your physician has requested that you have an echocardiogram. Echocardiography is a painless test that uses sound waves to create images of your heart. It provides your doctor with information about the size and shape of your heart and how well your heart's chambers and valves are working. This procedure takes approximately one hour. There are no restrictions for this procedure. Please do NOT wear cologne, perfume, aftershave, or lotions (deodorant is allowed). Please arrive 15 minutes prior to your appointment time.  Please note: We ask at that you not bring children with you during ultrasound (echo/ vascular) testing. Due to room size and safety concerns, children are not allowed in the ultrasound rooms during exams. Our front office staff cannot provide observation of children in our lobby area while testing is being conducted. An adult accompanying a patient to their appointment will only be allowed in the ultrasound room at the discretion of the ultrasound technician under special circumstances. We apologize for any inconvenience. Follow-Up: At [facility], you and your health needs are our priority.  As part of our continuing mission to provide you with exceptional heart care, we have created designated  Provider Care Teams.  These Care Teams include your primary Cardiologist (physician) and Advanced Practice Providers (APPs -  Physician Assistants and Nurse Practitioners) who all work together to provide you with the care you need, when you need it.  We recommend signing up for the patient portal called "MyChart".  Sign up information is provided on this After Visit Summary.  MyChart is used to connect with patients for Virtual Visits (Telemedicine).  Patients are able to view lab/test results, encounter notes, upcoming appointments, etc.  Non-urgent messages can be sent to your provider as well.   To learn more about what you can do with MyChart, go to [email or web address].    Your next appointment:   1 year(s)  Provider:   Dr. Eden Emms

## 2023-08-24 LAB — BASIC METABOLIC PANEL
BUN/Creatinine Ratio: 24 (ref 12–28)
BUN: 19 mg/dL (ref 8–27)
CO2: 26 mmol/L (ref 20–29)
Calcium: 9.8 mg/dL (ref 8.7–10.3)
Chloride: 97 mmol/L (ref 96–106)
Creatinine, Ser: 0.79 mg/dL (ref 0.57–1.00)
Glucose: 99 mg/dL (ref 70–99)
Potassium: 4.9 mmol/L (ref 3.5–5.2)
Sodium: 139 mmol/L (ref 134–144)
eGFR: 72 mL/min/{1.73_m2} (ref >59)

## 2023-08-31 ENCOUNTER — Other Ambulatory Visit: Payer: Self-pay

## 2023-08-31 DIAGNOSIS — Z79899 Other long term (current) drug therapy: Secondary | ICD-10-CM

## 2023-08-31 DIAGNOSIS — I10 Essential (primary) hypertension: Secondary | ICD-10-CM

## 2023-08-31 DIAGNOSIS — I5032 Chronic diastolic (congestive) heart failure: Secondary | ICD-10-CM

## 2023-08-31 MED ORDER — POTASSIUM CHLORIDE CRYS ER 20 MEQ PO TBCR: 20.0000 meq | EXTENDED_RELEASE_TABLET | Freq: Every day | ORAL | 3 refills | Status: DC

## 2023-09-23 ENCOUNTER — Ambulatory Visit (HOSPITAL_COMMUNITY): Payer: Medicare Other | Attending: Cardiology

## 2023-09-23 DIAGNOSIS — E7800 Pure hypercholesterolemia, unspecified: Secondary | ICD-10-CM | POA: Diagnosis not present

## 2023-09-23 DIAGNOSIS — I5032 Chronic diastolic (congestive) heart failure: Secondary | ICD-10-CM | POA: Diagnosis not present

## 2023-09-23 DIAGNOSIS — I10 Essential (primary) hypertension: Secondary | ICD-10-CM | POA: Diagnosis present

## 2023-09-23 LAB — ECHOCARDIOGRAM COMPLETE
Area-P 1/2: 2.83 cm2
MV M vel: 4.94 m/s
MV Peak grad: 97.6 mm[Hg]
S' Lateral: 2.31 cm

## 2023-12-01 ENCOUNTER — Other Ambulatory Visit: Payer: Self-pay

## 2023-12-01 DIAGNOSIS — I10 Essential (primary) hypertension: Secondary | ICD-10-CM

## 2023-12-01 MED ORDER — METOPROLOL SUCCINATE ER 25 MG PO TB24: 25.0000 mg | ORAL_TABLET | Freq: Every day | ORAL | 2 refills | Status: DC

## 2024-08-29 ENCOUNTER — Other Ambulatory Visit: Payer: Self-pay | Admitting: Cardiovascular Disease

## 2024-08-29 ENCOUNTER — Other Ambulatory Visit: Payer: Self-pay | Admitting: Cardiology

## 2024-08-29 DIAGNOSIS — I5032 Chronic diastolic (congestive) heart failure: Secondary | ICD-10-CM

## 2024-08-29 DIAGNOSIS — I10 Essential (primary) hypertension: Secondary | ICD-10-CM

## 2024-08-29 DIAGNOSIS — Z79899 Other long term (current) drug therapy: Secondary | ICD-10-CM

## 2024-09-21 NOTE — Progress Notes (Signed)
 " Cardiology Office Note:    Date:  09/29/2024   ID:  Nicole [name], Nicole [name] 11-13-1932, MRN [id number]  PCP:  Teresa Channel, MD   [facility] Providers Cardiologist:  Oneil Parchment, MD {  Referring MD: Teresa Channel, MD    History of Present Illness:    Nicole [name] is a 89 y.o. female with a hx of sjogren syndrome, chronic diastolic heart failure, Non Hodgkins lymphoma s/p chemo and rituxan who was previously followed by Dr. Parchment and [place] to me   Saw Dr. Parchment on 08/02/20 where she had recently suffered from a fall with possible syncope. She missed a step and hit her face. MRI showed  mild age-related cerebral atrophy and chronic microvascular ischemic changes.  She also had a remote right basal ganglia lacunar insult.  And also remote right middle cerebral peduncle microhemorrhage. Had cardiac monitor with Dr. Parchment showed NSR, occasionally episodes of paroxymal atrial tachycardia, occasional PVCs.    August 2023 primary increased diuretic for BP  Complained of palpitations at rest lasting minutes on office visit 09/16/22 Once in a while she has become hypotensive such as standing up after kneeling down in church. No syncope.  She denies any chest pain, headaches, syncope, orthopnea, or PND.  Living at [facility] now and enjoying it   Sees Hem/Onc for ? Lymphoma right Parotid Rx with rituximab  12/2021   Valsartan 160 mg added December 2025 for BP and K supplement stopped BP is in normal range    Past Medical History:  Diagnosis Date   Abscess of parotid gland 01/2011   w/parotid stones   Cancer (HCC) 1992   NHL--[facility]   Depression    Heartburn    IBS (irritable bowel syndrome)    Non Hodgkin's lymphoma (HCC)    Personal history of colonic polyps 04/13/2007   hyperplastic   Positional vertigo    Reflux    Sjogren's syndrome     Past Surgical History:  Procedure Laterality Date   APPENDECTOMY     BREAST BIOPSY Right 1986   Benign     CATARACT EXTRACTION  2008 &2010   bilateral   iredectomy  2008   left   LUNG REMOVAL, PARTIAL  04/1999   right lung   OOPHORECTOMY  2000   SALIVARY GLAND SURGERY  01/2011 & 04/2011   TONSILLECTOMY AND ADENOIDECTOMY     VEIN SURGERY  1966    Current Medications: Current Meds  Medication Sig   aspirin 81 MG tablet Take 81 mg by mouth daily.     Biotin 5000 MCG CAPS 1 tablet Orally Once a day   Calcium Carbonate-Vitamin D (CALCIUM-VITAMIN D) 600-200 MG-UNIT CAPS Take 1 tablet by mouth daily.    Cholecalciferol (VITAMIN D3) 1000 UNITS CAPS Take 1,000 Units by mouth daily.     Coenzyme Q10 (CO Q 10) 100 MG CAPS 1 tab Orally once a day   cyanocobalamin  (VITAMIN B12) 500 MCG tablet 1 tablet Orally once a week; Duration: 30 days   Glucosamine-Chondroitin 750-600 MG TABS Take 2 tablets by mouth daily.   hydrochlorothiazide  (HYDRODIURIL ) 25 MG tablet Take 1 tablet (25 mg total) by mouth daily.   meclizine (ANTIVERT) 12.5 MG tablet Take 12.5 mg by mouth every 6 (six) hours as needed for dizziness.   Methylsulfonylmethane (MSM) 1000 MG CAPS Take 1,000 mg by mouth daily.     metoprolol  succinate (TOPROL -XL) 25 MG 24 hr tablet Take 1 tablet by mouth once daily  Multiple Vitamin (MULTIVITAMIN) capsule Take 1 capsule by mouth daily.     OMEGA 3 1000 MG CAPS Take 1,000 mg by mouth 2 (two) times daily.     omeprazole (PRILOSEC) 10 MG capsule Take 10 mg by mouth every other day.   ondansetron  (ZOFRAN  ODT) 4 MG disintegrating tablet Take 1 tablet (4 mg total) by mouth every 8 (eight) hours as needed for nausea or vomiting.   Polyethyl Glycol-Propyl Glycol (SYSTANE OP) Apply 1 drop to eye every 4 (four) hours as needed (dry eyes). Both eyes   potassium chloride  SA (KLOR-CON  M) 20 MEQ tablet Take 1 tablet by mouth once daily   PREMARIN 0.45 MG tablet Take 1 tablet by mouth every other day.   rosuvastatin (CRESTOR) 5 MG tablet Take 5 mg by mouth 2 (two) times a week. Tuesday and Friday   UNABLE TO FIND Take  1 capsule by mouth daily. Calcium carbonate-vitamin d-mag-zinc   valsartan (DIOVAN) 160 MG tablet Take 160 mg by mouth daily.   vitamin C (ASCORBIC ACID) 500 MG tablet Take 500 mg by mouth daily.       Allergies:   Ace inhibitors and Protonix [pantoprazole sodium]   Social History   Socioeconomic History   Marital status: Divorced    Spouse name: Not on file   Number of children: 5   Years of education: Not on file   Highest education level: Not on file  Occupational History   Occupation: retired Teacher, Adult Education: RETIRED  Tobacco Use   Smoking status: Former   Smokeless tobacco: Never  Advertising Account Planner   Vaping status: Never Used  Substance and Sexual Activity   Alcohol use: No   Drug use: No   Sexual activity: Not on file  Other Topics Concern   Not on file  Social History Narrative   Not on file   Social Drivers of Health   Tobacco Use: Medium Risk (09/29/2024)   Patient History    Smoking Tobacco Use: Former    Smokeless Tobacco Use: Never    Passive Exposure: Not on Actuary Strain: Not on file  Food Insecurity: Not on file  Transportation Needs: Not on file  Physical Activity: Not on file  Stress: Not on file  Social Connections: Not on file  Depression (PHQ2-9): Not on file  Alcohol Screen: Not on file  Housing: Not on file  Utilities: Not on file  Health Literacy: Not on file     Family History: The patient's family history includes Celiac disease in her mother; Heart disease in her father and mother; Heart failure in her father; Kidney cancer in her daughter.  ROS:   Review of Systems  Constitutional:  Negative for chills and fever.  HENT:  Negative for congestion and ear pain.   Eyes:  Negative for blurred vision and pain.  Respiratory:  Positive for shortness of breath (with exertion). Negative for cough.   Cardiovascular:  Positive for palpitations. Negative for chest pain, orthopnea, claudication, leg swelling and PND.   Gastrointestinal:  Negative for heartburn and nausea.  Genitourinary:  Negative for frequency and urgency.  Musculoskeletal:  Positive for joint pain. Negative for myalgias.  Skin:  Negative for itching and rash.  Neurological:  Positive for dizziness. Negative for headaches.  Endo/Heme/Allergies:  Does not bruise/bleed easily.  Psychiatric/Behavioral:  The patient does not have insomnia.      EKGs/Labs/Other Studies Reviewed:    The following studies were reviewed today:  CT  Neck/Thyroid   09/04/2022  ([facility]): CONCLUSION:  1. No enlarged or morphologically suspicious lymph nodes in the neck.  2.  Progressive cystic changes and mineralization involving the right parotid tail, likely relating to chronic inflammation.   CT Chest/Abdomen/Pelvis  09/04/2022  ([facility]): Findings: .  Heart/vessel: Normal heart size. No pericardial effusion. Aorta normal in caliber and appearance. No pulmonary embolism.  .  Vascular: Calcified aortoiliac atherosclerosis.   CONCLUSION:  1. No enlarged lymph nodes in the chest, abdomen, or pelvis.  2.  Further evolution and decreased size of a left upper lobe lesion, present since 2014 favored to reflect an infectious/inflammatory etiology.  3.  Please see contemporaneous CT neck for further evaluation.   Cardiac Monitor 07/2020: Sinus rhythm 68 bpm on average. Occasional paroxysmal atrial tachycardia episodes. Usually brief. Few seconds duration. Benign. No pauses. No ventricular tachycardia. No atrial fibrillation. Occasional premature atrial contractions. Reassuring overall monitor.   TTE 08/10/19: IMPRESSIONS   1. Left ventricular ejection fraction, by visual estimation, is 60 to  65%. The left ventricle has normal function. There is no left ventricular  hypertrophy.   2. The left ventricle has no regional wall motion abnormalities.   3. Global right ventricle has normal systolic function.The right  ventricular size is normal.  No increase in right ventricular wall  thickness.   4. The mitral valve is normal in structure. No evidence of mitral valve  regurgitation.   5. The tricuspid valve is normal in structure. Tricuspid valve  regurgitation is trivial.   6. The aortic valve was not well visualized. Aortic valve regurgitation  is not visualized. No evidence of aortic valve stenosis.   7. The pulmonic valve was not well visualized. Pulmonic valve  regurgitation is not visualized.   8. Left atrial size was normal.   9. Right atrial size was normal.  10. The inferior vena cava is normal in size with greater than 50%  respiratory variability, suggesting right atrial pressure of 3 mmHg.  11. The tricuspid regurgitant velocity is 2.04 m/s, and with an assumed  right atrial pressure of 3 mmHg, the estimated right ventricular systolic  pressure is normal at 19.6 mmHg.  EKG:  EKG is personally reviewed. 09/16/2022:  Sinus bradycardia. Rate 57 bpm. 09/11/21: Sinus rhythm, rate 65 bpm  Recent Labs: No results found for requested labs within last 365 days.   Recent Lipid Panel No results found for: CHOL, TRIG, HDL, CHOLHDL, VLDL, LDLCALC, LDLDIRECT    Physical Exam:    VS:  BP 122/71   Pulse 63   Ht 5' 3 (1.6 m)   Wt 140 lb (63.5 kg)   LMP  (LMP Unknown)   SpO2 95%   BMI 24.80 kg/m     Wt Readings from Last 3 Encounters:  09/29/24 140 lb (63.5 kg)  08/23/23 145 lb 3.2 oz (65.9 kg)  09/16/22 145 lb (65.8 kg)    Affect appropriate Elderly female  HEENT:  Mild right parotid tenderness/edema Neck supple with no adenopathy JVP normal no bruits no thyromegaly Lungs prior right thoracotomy with RU/RMLobectomy Heart:  S1/S2 3/6 SEM murmur, no rub, gallop or click PMI normal Abdomen: benighn, BS positve, no tenderness, no AAA no bruit.  No HSM or HJR prior appendectomy  Distal pulses intact with no bruits No edema Neuro non-focal Skin warm and dry No muscular weakness   ASSESSMENT:     1. Chronic diastolic heart failure (HCC)       PLAN:  In order of problems listed above:  #Chronic Diastolic Heart Failure: Doing well. Currently euvolemic. Previous nuclear stress with no ischemia. Normal EF on TTE in 2020. TTE 09/23/23 EF 60-65% Normal diastolic parameters mild MR  -Continue metop 25mg  XL daily -Continue HCTZ 25mg  daily -Low Na diet  #Syncope: Thought to be initially mechanical, however, patient is very concerned that she syncopized prior to hitting her head. Cardiac monitor with paroxysmal atrial tach that was brief but no other significant arrhythmias. No recurrence of symptoms. Normal EF on echo  -Continue to monitor  #HTN: Slightly elevated at home but also with orthostasis and history of falls. Will continue current regimen for now to avoid low blood pressure.  -Continue HCTZ 25mg  daily -Continue metop 25mg  XL daily -If orthostasis worsens, can stop HCTZ  #HLD: -Continue crestor 5mg  daily -Follow-up with PCP  # Marginal Zone Lymphoma Parotid, salivary and lung - prior rituximab  Rx - cell counts ok - f/u oncology       Follow-up  in a year    Signed, Maude Emmer, MD  09/29/2024 10:34 AM     [facility] "

## 2024-09-29 ENCOUNTER — Ambulatory Visit: Attending: Cardiovascular Disease | Admitting: Cardiovascular Disease

## 2024-09-29 ENCOUNTER — Encounter: Payer: Self-pay | Admitting: Cardiovascular Disease

## 2024-09-29 VITALS — BP 122/71 | HR 63 | Ht 63.0 in | Wt 140.0 lb

## 2024-09-29 DIAGNOSIS — I5032 Chronic diastolic (congestive) heart failure: Secondary | ICD-10-CM | POA: Diagnosis not present

## 2024-09-29 NOTE — Patient Instructions (Signed)
 Medication Instructions:  Your physician recommends that you continue on your current medications as directed. Please refer to the Current Medication list given to you today.  *If you need a refill on your cardiac medications before your next appointment, please call your pharmacy*  Lab Work: None ordered If you have labs (blood work) drawn today and your tests are completely normal, you will receive your results only by: MyChart Message (if you have MyChart) OR A paper copy in the mail If you have any lab test that is abnormal or we need to change your treatment, we will call you to review the results.  Testing/Procedures: None ordered  Follow-Up: At [facility], you and your health needs are our priority.  As part of our continuing mission to provide you with exceptional heart care, our providers are all part of one team.  This team includes your primary Cardiologist (physician) and Advanced Practice Providers or APPs (Physician Assistants and Nurse Practitioners) who all work together to provide you with the care you need, when you need it.  Your next appointment:   1 year(s)  Provider:   Maude Emmer, MD    We recommend signing up for the patient portal called MyChart.  Sign up information is provided on this After Visit Summary.  MyChart is used to connect with patients for Virtual Visits (Telemedicine).  Patients are able to view lab/test results, encounter notes, upcoming appointments, etc.  Non-urgent messages can be sent to your provider as well.   To learn more about what you can do with MyChart, go to [email or web address].

## 2024-10-02 ENCOUNTER — Other Ambulatory Visit: Payer: Self-pay | Admitting: Cardiovascular Disease

## 2024-10-02 DIAGNOSIS — I10 Essential (primary) hypertension: Secondary | ICD-10-CM
